# Patient Record
Sex: Female | Born: 1940
Health system: Southern US, Community
[De-identification: ages and names within clinical notes are randomized; demographics above are authoritative.]

## PROBLEM LIST (undated history)

## (undated) DIAGNOSIS — Z96651 Presence of right artificial knee joint: Secondary | ICD-10-CM

## (undated) DIAGNOSIS — I8390 Asymptomatic varicose veins of unspecified lower extremity: Secondary | ICD-10-CM

## (undated) DIAGNOSIS — I2510 Atherosclerotic heart disease of native coronary artery without angina pectoris: Secondary | ICD-10-CM

## (undated) DIAGNOSIS — M8580 Other specified disorders of bone density and structure, unspecified site: Secondary | ICD-10-CM

## (undated) DIAGNOSIS — E785 Hyperlipidemia, unspecified: Secondary | ICD-10-CM

## (undated) DIAGNOSIS — M1990 Unspecified osteoarthritis, unspecified site: Secondary | ICD-10-CM

## (undated) DIAGNOSIS — Z6835 Body mass index (BMI) 35.0-35.9, adult: Secondary | ICD-10-CM

## (undated) DIAGNOSIS — E559 Vitamin D deficiency, unspecified: Secondary | ICD-10-CM

## (undated) DIAGNOSIS — R011 Cardiac murmur, unspecified: Secondary | ICD-10-CM

## (undated) DIAGNOSIS — E669 Obesity, unspecified: Secondary | ICD-10-CM

## (undated) DIAGNOSIS — K219 Gastro-esophageal reflux disease without esophagitis: Secondary | ICD-10-CM

## (undated) DIAGNOSIS — M3500 Sicca syndrome, unspecified: Secondary | ICD-10-CM

## (undated) DIAGNOSIS — R0981 Nasal congestion: Secondary | ICD-10-CM

## (undated) DIAGNOSIS — M549 Dorsalgia, unspecified: Secondary | ICD-10-CM

## (undated) DIAGNOSIS — H04129 Dry eye syndrome of unspecified lacrimal gland: Secondary | ICD-10-CM

## (undated) DIAGNOSIS — I10 Essential (primary) hypertension: Secondary | ICD-10-CM

## (undated) DIAGNOSIS — K5900 Constipation, unspecified: Secondary | ICD-10-CM

## (undated) HISTORY — DX: Nasal congestion: R09.81

## (undated) HISTORY — DX: Other specified disorders of bone density and structure, unspecified site: M85.80

## (undated) HISTORY — DX: Atherosclerotic heart disease of native coronary artery without angina pectoris: I25.10

## (undated) HISTORY — DX: Vitamin D deficiency, unspecified: E55.9

## (undated) HISTORY — DX: Constipation, unspecified: K59.00

## (undated) HISTORY — DX: Sjogren syndrome, unspecified: M35.00

## (undated) HISTORY — DX: Asymptomatic varicose veins of unspecified lower extremity: I83.90

## (undated) HISTORY — DX: Hyperlipidemia, unspecified: E78.5

## (undated) HISTORY — PX: APPENDECTOMY: SHX54

## (undated) HISTORY — DX: Presence of right artificial knee joint: Z96.651

## (undated) HISTORY — PX: ROTATOR CUFF REPAIR: SHX139

## (undated) HISTORY — DX: Unspecified osteoarthritis, unspecified site: M19.90

## (undated) HISTORY — PX: ABDOMINAL HYSTERECTOMY: SHX81

## (undated) HISTORY — DX: Dorsalgia, unspecified: M54.9

## (undated) HISTORY — PX: TONSILLECTOMY: SUR1361

## (undated) HISTORY — DX: Dry eye syndrome of unspecified lacrimal gland: H04.129

## (undated) HISTORY — DX: Body mass index (BMI) 35.0-35.9, adult: Z68.35

## (undated) HISTORY — DX: Gastro-esophageal reflux disease without esophagitis: K21.9

## (undated) HISTORY — DX: Obesity, unspecified: E66.9

## (undated) HISTORY — DX: Cardiac murmur, unspecified: R01.1

---

## 2002-06-12 ENCOUNTER — Encounter: Payer: Self-pay | Admitting: Rheumatology

## 2002-06-12 ENCOUNTER — Encounter: Admission: RE | Admit: 2002-06-12 | Discharge: 2002-06-12 | Payer: Self-pay | Admitting: Rheumatology

## 2002-06-16 ENCOUNTER — Encounter: Payer: Self-pay | Admitting: *Deleted

## 2002-06-16 ENCOUNTER — Encounter: Admission: RE | Admit: 2002-06-16 | Discharge: 2002-06-16 | Payer: Self-pay | Admitting: *Deleted

## 2002-07-31 ENCOUNTER — Ambulatory Visit (HOSPITAL_BASED_OUTPATIENT_CLINIC_OR_DEPARTMENT_OTHER): Admission: RE | Admit: 2002-07-31 | Discharge: 2002-07-31 | Payer: Self-pay | Admitting: Orthopedic Surgery

## 2007-03-24 ENCOUNTER — Encounter: Admission: RE | Admit: 2007-03-24 | Discharge: 2007-03-24 | Payer: Self-pay | Admitting: *Deleted

## 2008-10-23 ENCOUNTER — Encounter: Admission: RE | Admit: 2008-10-23 | Discharge: 2008-10-23 | Payer: Self-pay | Admitting: Family Medicine

## 2009-05-08 ENCOUNTER — Encounter: Admission: RE | Admit: 2009-05-08 | Discharge: 2009-08-06 | Payer: Self-pay | Admitting: Family Medicine

## 2009-05-10 ENCOUNTER — Encounter: Admission: RE | Admit: 2009-05-10 | Discharge: 2009-05-10 | Payer: Self-pay | Admitting: Family Medicine

## 2009-11-06 ENCOUNTER — Encounter: Admission: RE | Admit: 2009-11-06 | Discharge: 2009-11-06 | Payer: Self-pay | Admitting: Family Medicine

## 2010-08-12 ENCOUNTER — Encounter: Admission: RE | Admit: 2010-08-12 | Discharge: 2010-09-24 | Payer: Self-pay | Admitting: Family Medicine

## 2011-01-18 ENCOUNTER — Encounter: Payer: Self-pay | Admitting: *Deleted

## 2011-04-02 ENCOUNTER — Other Ambulatory Visit: Payer: Self-pay | Admitting: Family Medicine

## 2011-04-02 DIAGNOSIS — Z1231 Encounter for screening mammogram for malignant neoplasm of breast: Secondary | ICD-10-CM

## 2011-04-09 ENCOUNTER — Ambulatory Visit
Admission: RE | Admit: 2011-04-09 | Discharge: 2011-04-09 | Disposition: A | Discharge status: Home / Self Care | Payer: Medicare Other | Source: Ambulatory Visit | Attending: Family Medicine | Admitting: Family Medicine

## 2011-04-09 DIAGNOSIS — Z1231 Encounter for screening mammogram for malignant neoplasm of breast: Secondary | ICD-10-CM

## 2011-05-06 ENCOUNTER — Other Ambulatory Visit: Payer: Self-pay | Admitting: Family Medicine

## 2011-05-06 DIAGNOSIS — M8580 Other specified disorders of bone density and structure, unspecified site: Secondary | ICD-10-CM

## 2011-05-15 NOTE — Op Note (Signed)
Beverly Moreno, Beverly Moreno                          ACCOUNT NO.:  [id number]   MEDICAL RECORD NO.:  [id number]                   PATIENT TYPE:   LOCATION:                                       FACILITY:   PHYSICIAN:  Elana Alm. Thurston Hole, M.D.              DATE OF BIRTH:   DATE OF PROCEDURE:  07/31/2002  DATE OF DISCHARGE:                                 OPERATIVE REPORT   PREOPERATIVE DIAGNOSES:  1. Right shoulder rotator cuff tear.  2. Right shoulder partial glenoid labrum tear.  3. Right shoulder partial biceps tendon tear.  4. Right shoulder impingement.  5. Right shoulder acromioclavicular joint arthrosis.   POSTOPERATIVE DIAGNOSES:  1. Right shoulder rotator cuff tear.  2. Right shoulder partial glenoid labrum tear.  3. Right shoulder partial biceps tendon tear.  4. Right shoulder impingement.  5. Right shoulder acromioclavicular joint arthrosis.   PROCEDURES:  1. Right shoulder examination under anesthesia followed arthroscopic partial     labrum tear and partial biceps tendon tear debridement.  2. Right shoulder rotator cuff repair using Arthrex suture anchor x 1.  3. Right shoulder subacromial decompression.  4. Right shoulder distal clavicle excision.   SURGEON:  Elana Alm. Thurston Hole, M.D.   ASSISTANT:  Julien Girt, P.A.   ANESTHESIA:  General.   OPERATIVE TIME:  One hour and ten minutes.   COMPLICATIONS:  None.   INDICATIONS FOR PROCEDURE:  The patient is a 70 year old woman who sustained  a right shoulder dislocation approximately one-and-a-half years ago.  She  underwent extensive physical therapy, but has had increasing pain in her  shoulder with exam and MRI now documenting a complete rotator cuff tear and  partial labrum tear.  She has failed conservative care and is now to undergo  arthroscopy.   DESCRIPTION OF PROCEDURE:  The patient was brought to the operating room on  July 31, 2002, after a block had been placed in the holding room.  She was  placed on the operating table in the supine position.  After being placed  under general anesthesia, her right shoulder was examined under anesthesia.  She had full range of motion and her shoulder was stable to ligamentous  exam.  After this was done, she was placed in the beach chair position and  her shoulder and arm were prepped using sterile Duraprep and draped using  sterile technique.  She received Ancef 1 g IV preoperatively for  prophylaxis.  Initially the arthroscopy was performed through a posterior  arthroscopic portal.  The arthroscope with a pump attached was placed into  an anterior portal and arthroscopic probe was placed.  On initial  inspection, the articular cartilage and glenohumeral joint were intact,  except for grade 2 changes over her humeral head, which was debrided.  The  anterior labrum in the mid portion was intact.  The inferior labrum and  anterior inferior glenoid ligament complex were intact.  The superior labrum  showed partial tearing 25-30%, as well as a partial tear of the biceps  tendon anchor, which was debrided.  The biceps tendon anchor otherwise was  well affixed.  She had significant partial tearing of the biceps tendon  itself, 25-30%, which was debrided.  The posterior labrum was intact.  The  inferior capsular recess was free of pathology.  The rotator cuff showed a  complete tear of the supraspinatus, which was partially debrided  arthroscopically.  The infraspinatus, teres minor, and subscapularis were  intact.  The subacromial space was entered and a lateral arthroscopic portal  was made.  A large amount of bursitis was resected.  The rotator cuff tear  was further debrided arthroscopically.  Subacromial decompression was  carried out, removing 6 mm of the undersurface of the anterior,  anterolateral, and anteromedial acromion and a CA ligament release carried  out as well.  The [facility] joint was exposed.  Significant arthrosis and spurring  noted  in this joint and the distal 5 mm of the clavicle was resected with a  6 mm bur.  After this was done and through the lateral arthroscopic portal,  this was extended through a 3 cm deltoid-splitting incision.  The underlying  subcutaneous tissues were incised in line with the skin incision.  The  deltoid muscle was split longitudinally, entering the subacromial space.  The rotator cuff tear was further debrided sharply and then an Arthrex  suture anchor was placed in the greater tuberosity.  Each of the sutures  were then passed through the rotator cuff and tied down, thus resecuring the  rotator cuff back down to the greater tuberosity.  After this was done,  there was found to be excellent restoration of stability to the rotator  cuff.  The shoulder could be brought through a full range of motion with no  impingement on the repair.  A subacromial pain catheter was then placed for  postoperative pain control.  The wound was thoroughly irrigated and then the  deltoid fascia closed with 0 Vicryl.  The subcutaneous tissues were closed  with 2-0 Vicryl.  The subcuticular layer was closed with 3-0 Prolene and  Steri-Strips were applied.  Sterile dressings and a sling were applied.  The  patient was awaken and taken to the recovery room in stable condition.   FOLLOW-UP CARE:  The patient will be followed overnight through the recovery  care center for IV pain control and neurovascular monitoring.  Discharge  tomorrow on Percocet and Naprosyn.  Begin early physical therapy for passive  range of motion only.  She will be seen back in the office in a week for  sutures out and follow-up.  Her family will remove her pain catheter per our  instructions in 48 hours.                                                Robert A. Thurston Hole, M.D.    RAW/MEDQ  D:  07/31/2002  T:  08/04/2002  Job:  [id number]

## 2011-11-09 ENCOUNTER — Ambulatory Visit
Admission: RE | Admit: 2011-11-09 | Discharge: 2011-11-09 | Disposition: A | Discharge status: Home / Self Care | Payer: BLUE CROSS/BLUE SHIELD | Source: Ambulatory Visit | Attending: Family Medicine | Admitting: Family Medicine

## 2011-11-09 DIAGNOSIS — M8580 Other specified disorders of bone density and structure, unspecified site: Secondary | ICD-10-CM

## 2011-12-30 DIAGNOSIS — H5319 Other subjective visual disturbances: Secondary | ICD-10-CM | POA: Diagnosis not present

## 2011-12-30 DIAGNOSIS — H43399 Other vitreous opacities, unspecified eye: Secondary | ICD-10-CM | POA: Diagnosis not present

## 2011-12-30 DIAGNOSIS — H35369 Drusen (degenerative) of macula, unspecified eye: Secondary | ICD-10-CM | POA: Diagnosis not present

## 2011-12-30 DIAGNOSIS — H43819 Vitreous degeneration, unspecified eye: Secondary | ICD-10-CM | POA: Diagnosis not present

## 2012-01-12 DIAGNOSIS — L259 Unspecified contact dermatitis, unspecified cause: Secondary | ICD-10-CM | POA: Diagnosis not present

## 2012-01-12 DIAGNOSIS — L570 Actinic keratosis: Secondary | ICD-10-CM | POA: Diagnosis not present

## 2012-01-19 DIAGNOSIS — M1990 Unspecified osteoarthritis, unspecified site: Secondary | ICD-10-CM | POA: Diagnosis not present

## 2012-01-19 DIAGNOSIS — M549 Dorsalgia, unspecified: Secondary | ICD-10-CM | POA: Diagnosis not present

## 2012-01-19 DIAGNOSIS — M5137 Other intervertebral disc degeneration, lumbosacral region: Secondary | ICD-10-CM | POA: Diagnosis not present

## 2012-01-25 DIAGNOSIS — M24819 Other specific joint derangements of unspecified shoulder, not elsewhere classified: Secondary | ICD-10-CM | POA: Diagnosis not present

## 2012-02-19 DIAGNOSIS — L578 Other skin changes due to chronic exposure to nonionizing radiation: Secondary | ICD-10-CM | POA: Diagnosis not present

## 2012-04-13 DIAGNOSIS — Z961 Presence of intraocular lens: Secondary | ICD-10-CM | POA: Diagnosis not present

## 2012-04-13 DIAGNOSIS — H26499 Other secondary cataract, unspecified eye: Secondary | ICD-10-CM | POA: Diagnosis not present

## 2012-04-13 DIAGNOSIS — H524 Presbyopia: Secondary | ICD-10-CM | POA: Diagnosis not present

## 2012-04-13 DIAGNOSIS — H02829 Cysts of unspecified eye, unspecified eyelid: Secondary | ICD-10-CM | POA: Diagnosis not present

## 2012-04-13 DIAGNOSIS — H11449 Conjunctival cysts, unspecified eye: Secondary | ICD-10-CM | POA: Diagnosis not present

## 2012-06-14 DIAGNOSIS — I10 Essential (primary) hypertension: Secondary | ICD-10-CM | POA: Diagnosis not present

## 2012-06-14 DIAGNOSIS — M949 Disorder of cartilage, unspecified: Secondary | ICD-10-CM | POA: Diagnosis not present

## 2012-06-14 DIAGNOSIS — M899 Disorder of bone, unspecified: Secondary | ICD-10-CM | POA: Diagnosis not present

## 2012-06-14 DIAGNOSIS — E782 Mixed hyperlipidemia: Secondary | ICD-10-CM | POA: Diagnosis not present

## 2012-06-22 ENCOUNTER — Other Ambulatory Visit: Payer: Self-pay | Admitting: Family Medicine

## 2012-06-22 DIAGNOSIS — Z1231 Encounter for screening mammogram for malignant neoplasm of breast: Secondary | ICD-10-CM

## 2012-07-01 ENCOUNTER — Ambulatory Visit
Admission: RE | Admit: 2012-07-01 | Discharge: 2012-07-01 | Disposition: A | Discharge status: Home / Self Care | Payer: BLUE CROSS/BLUE SHIELD | Source: Ambulatory Visit | Attending: Family Medicine | Admitting: Family Medicine

## 2012-07-01 DIAGNOSIS — Z1231 Encounter for screening mammogram for malignant neoplasm of breast: Secondary | ICD-10-CM

## 2012-08-05 DIAGNOSIS — M1710 Unilateral primary osteoarthritis, unspecified knee: Secondary | ICD-10-CM | POA: Diagnosis not present

## 2012-08-05 DIAGNOSIS — IMO0002 Reserved for concepts with insufficient information to code with codable children: Secondary | ICD-10-CM | POA: Diagnosis not present

## 2012-08-27 DIAGNOSIS — M25569 Pain in unspecified knee: Secondary | ICD-10-CM | POA: Diagnosis not present

## 2012-09-01 DIAGNOSIS — M25569 Pain in unspecified knee: Secondary | ICD-10-CM | POA: Diagnosis not present

## 2012-09-27 DIAGNOSIS — IMO0002 Reserved for concepts with insufficient information to code with codable children: Secondary | ICD-10-CM | POA: Diagnosis not present

## 2012-09-27 DIAGNOSIS — M1710 Unilateral primary osteoarthritis, unspecified knee: Secondary | ICD-10-CM | POA: Diagnosis not present

## 2012-10-31 DIAGNOSIS — Z23 Encounter for immunization: Secondary | ICD-10-CM | POA: Diagnosis not present

## 2012-11-01 DIAGNOSIS — D1801 Hemangioma of skin and subcutaneous tissue: Secondary | ICD-10-CM | POA: Diagnosis not present

## 2012-11-01 DIAGNOSIS — L723 Sebaceous cyst: Secondary | ICD-10-CM | POA: Diagnosis not present

## 2012-11-01 DIAGNOSIS — L819 Disorder of pigmentation, unspecified: Secondary | ICD-10-CM | POA: Diagnosis not present

## 2012-11-01 DIAGNOSIS — L821 Other seborrheic keratosis: Secondary | ICD-10-CM | POA: Diagnosis not present

## 2012-11-01 DIAGNOSIS — L909 Atrophic disorder of skin, unspecified: Secondary | ICD-10-CM | POA: Diagnosis not present

## 2012-11-01 DIAGNOSIS — L919 Hypertrophic disorder of the skin, unspecified: Secondary | ICD-10-CM | POA: Diagnosis not present

## 2012-11-21 DIAGNOSIS — M542 Cervicalgia: Secondary | ICD-10-CM | POA: Diagnosis not present

## 2013-03-29 DIAGNOSIS — M1710 Unilateral primary osteoarthritis, unspecified knee: Secondary | ICD-10-CM | POA: Diagnosis not present

## 2013-03-29 DIAGNOSIS — IMO0002 Reserved for concepts with insufficient information to code with codable children: Secondary | ICD-10-CM | POA: Diagnosis not present

## 2013-04-05 DIAGNOSIS — IMO0002 Reserved for concepts with insufficient information to code with codable children: Secondary | ICD-10-CM | POA: Diagnosis not present

## 2013-04-05 DIAGNOSIS — M1710 Unilateral primary osteoarthritis, unspecified knee: Secondary | ICD-10-CM | POA: Diagnosis not present

## 2013-04-12 DIAGNOSIS — M1710 Unilateral primary osteoarthritis, unspecified knee: Secondary | ICD-10-CM | POA: Diagnosis not present

## 2013-04-12 DIAGNOSIS — IMO0002 Reserved for concepts with insufficient information to code with codable children: Secondary | ICD-10-CM | POA: Diagnosis not present

## 2013-05-03 DIAGNOSIS — H04129 Dry eye syndrome of unspecified lacrimal gland: Secondary | ICD-10-CM | POA: Diagnosis not present

## 2013-05-03 DIAGNOSIS — H43399 Other vitreous opacities, unspecified eye: Secondary | ICD-10-CM | POA: Diagnosis not present

## 2013-05-03 DIAGNOSIS — H35369 Drusen (degenerative) of macula, unspecified eye: Secondary | ICD-10-CM | POA: Diagnosis not present

## 2013-05-03 DIAGNOSIS — H26499 Other secondary cataract, unspecified eye: Secondary | ICD-10-CM | POA: Diagnosis not present

## 2013-05-26 DIAGNOSIS — M1710 Unilateral primary osteoarthritis, unspecified knee: Secondary | ICD-10-CM | POA: Diagnosis not present

## 2013-05-26 DIAGNOSIS — IMO0002 Reserved for concepts with insufficient information to code with codable children: Secondary | ICD-10-CM | POA: Diagnosis not present

## 2013-07-04 DIAGNOSIS — M949 Disorder of cartilage, unspecified: Secondary | ICD-10-CM | POA: Diagnosis not present

## 2013-07-04 DIAGNOSIS — M899 Disorder of bone, unspecified: Secondary | ICD-10-CM | POA: Diagnosis not present

## 2013-07-04 DIAGNOSIS — I10 Essential (primary) hypertension: Secondary | ICD-10-CM | POA: Diagnosis not present

## 2013-07-04 DIAGNOSIS — E782 Mixed hyperlipidemia: Secondary | ICD-10-CM | POA: Diagnosis not present

## 2013-10-05 DIAGNOSIS — Z23 Encounter for immunization: Secondary | ICD-10-CM | POA: Diagnosis not present

## 2013-10-20 DIAGNOSIS — M1710 Unilateral primary osteoarthritis, unspecified knee: Secondary | ICD-10-CM | POA: Diagnosis not present

## 2013-10-20 DIAGNOSIS — IMO0002 Reserved for concepts with insufficient information to code with codable children: Secondary | ICD-10-CM | POA: Diagnosis not present

## 2013-10-30 DIAGNOSIS — IMO0002 Reserved for concepts with insufficient information to code with codable children: Secondary | ICD-10-CM | POA: Diagnosis not present

## 2013-10-30 DIAGNOSIS — M1710 Unilateral primary osteoarthritis, unspecified knee: Secondary | ICD-10-CM | POA: Diagnosis not present

## 2013-10-31 DIAGNOSIS — L821 Other seborrheic keratosis: Secondary | ICD-10-CM | POA: Diagnosis not present

## 2013-10-31 DIAGNOSIS — L819 Disorder of pigmentation, unspecified: Secondary | ICD-10-CM | POA: Diagnosis not present

## 2013-10-31 DIAGNOSIS — L909 Atrophic disorder of skin, unspecified: Secondary | ICD-10-CM | POA: Diagnosis not present

## 2013-10-31 DIAGNOSIS — D1801 Hemangioma of skin and subcutaneous tissue: Secondary | ICD-10-CM | POA: Diagnosis not present

## 2013-11-06 DIAGNOSIS — M1710 Unilateral primary osteoarthritis, unspecified knee: Secondary | ICD-10-CM | POA: Diagnosis not present

## 2013-11-06 DIAGNOSIS — IMO0002 Reserved for concepts with insufficient information to code with codable children: Secondary | ICD-10-CM | POA: Diagnosis not present

## 2014-01-02 DIAGNOSIS — IMO0002 Reserved for concepts with insufficient information to code with codable children: Secondary | ICD-10-CM | POA: Diagnosis not present

## 2014-01-02 DIAGNOSIS — M1710 Unilateral primary osteoarthritis, unspecified knee: Secondary | ICD-10-CM | POA: Diagnosis not present

## 2014-03-07 DIAGNOSIS — IMO0002 Reserved for concepts with insufficient information to code with codable children: Secondary | ICD-10-CM | POA: Diagnosis not present

## 2014-03-07 DIAGNOSIS — R011 Cardiac murmur, unspecified: Secondary | ICD-10-CM | POA: Diagnosis not present

## 2014-03-07 DIAGNOSIS — M1710 Unilateral primary osteoarthritis, unspecified knee: Secondary | ICD-10-CM | POA: Diagnosis not present

## 2014-03-08 ENCOUNTER — Other Ambulatory Visit: Payer: Self-pay | Admitting: Orthopedic Surgery

## 2014-03-22 ENCOUNTER — Encounter (HOSPITAL_COMMUNITY): Payer: Self-pay | Admitting: Pharmacy Technician

## 2014-03-23 ENCOUNTER — Other Ambulatory Visit: Payer: Self-pay | Admitting: Orthopedic Surgery

## 2014-03-23 NOTE — H&P (Signed)
Beverly Moreno. Culliver DOB: 1941/07/21 Married / Language: English / Race: White Female  Date of Admission: 04-09-2014  Chief Complaint:  Right Knee Pain  History of Present Illness The patient is a 73 year old female who comes in for a preoperative History and Physical. The patient is scheduled for a right total knee arthroplasty to be performed by Dr. Dione Plover. Aluisio, MD at [facility] on 04-18-2014. The patient is a 73 year old female who presents for follow up of their knee. The patient is being followed for their right osteoarthritis. They are now 7 week(s) out from Syvisc series . Symptoms reported today include: pain (lateral as well as posterior ), swelling and grinding. The patient feels that they are doing poorly. Current treatment includes: NSAIDs (Ibuprofen 800mg ) and water aerobics. The following medication has been used for pain control: none. The patient has not gotten any relief of their symptoms with viscosupplementation. She states that the knee is bothering her at all times. It has gotten progressively worse over the past year or so. It is hurting at all times and limiting what she can and can not do. The visco supplement injections did not help this time. She is now ready to proceed with knee surgery. They have been treated conservatively in the past for the above stated problem and despite conservative measures, they continue to have progressive pain and severe functional limitations and dysfunction. They have failed non-operative management including home exercise, medications, and injections. It is felt that they would benefit from undergoing total joint replacement. Risks and benefits of the procedure have been discussed with the patient and they elect to proceed with surgery. There are no active contraindications to surgery such as ongoing infection or rapidly progressive neurological disease.  Allergies No Known Drug Allergies   Problem List/Past  Medical Pain, Lumbar (LBP) (724.2) Knee pain (719.46) Shoulder instability (718.81) Osteoarthritis, Lumbar (715.98) Primary osteoarthritis of one knee (715.16) High blood pressure Osteoarthritis Osteoporosis Hemorrhoids Hypercholesterolemia Varicose veins Menopause   Family History Osteoporosis. mother and sister Osteoarthritis. mother and sister Hypertension. mother and father Cancer. sister grandmother mothers side Heart Disease. First Degree Relatives. mother Cerebrovascular Accident. First Degree Relatives. father   Social History Previously in rehab. no Pain Contract. no Drug/Alcohol Rehab (Previously). no Tobacco / smoke exposure. no Number of flights of stairs before winded. 2-3 Illicit drug use. no Exercise. Exercises daily; does other Exercises daily; does running / walking and other Marital status. married Living situation. live with spouse Tobacco use. Former smoker. former smoker; smoke(d) 1 pack(s) per day former smoker; smoke(d) 1 1/2 pack(s) per day Children. 2 Alcohol use. current drinker; drinks wine; less than 5 per week Drug/Alcohol Rehab (Currently). no Current work status. retired Regulatory affairs officer. Healthcare POA   Medication History Benicar HCT (20-12.5MG  Tablet, Oral) Active. Lipitor (20MG  Tablet, Oral) Active. Ocuvite Adult Formula ( Oral) Active. Biotin Forte (3MG  Tablet, Oral) Active. Calcium Citrate + ( Oral) Active. Omega 3 ( Oral) Specific dose unknown - Active.   Past Surgical History Cataract Surgery. bilateral Tonsillectomy Hysterectomy. Date: 63. complete (non-cancerous) Appendectomy Rotator Cuff Repair. Date: 2002. right   Review of Systems General:Not Present- Chills, Fever, Night Sweats, Fatigue, Weight Gain, Weight Loss and Memory Loss. Skin:Not Present- Hives, Itching, Rash, Eczema and Lesions. HEENT:Not Present- Tinnitus, Headache, Double Vision, Visual Loss, Hearing Loss  and Dentures. Respiratory:Not Present- Shortness of breath with exertion, Shortness of breath at rest, Allergies, Coughing up blood and Chronic Cough. Cardiovascular:Not Present- Chest Pain, Racing/skipping  heartbeats, Difficulty Breathing Lying Down, Murmur, Swelling and Palpitations. Gastrointestinal:Present- Constipation. Not Present- Bloody Stool, Heartburn, Abdominal Pain, Vomiting, Nausea, Diarrhea, Difficulty Swallowing, Jaundice and Loss of appetitie. Female Genitourinary:Present- Urinating at Night. Not Present- Blood in Urine, Urinary frequency, Weak urinary stream, Discharge, Flank Pain, Incontinence, Painful Urination, Urgency and Urinary Retention. Musculoskeletal:Present- Joint Pain and Morning Stiffness. Not Present- Muscle Weakness, Muscle Pain, Joint Swelling, Back Pain and Spasms. Neurological:Not Present- Tremor, Dizziness, Blackout spells, Paralysis, Difficulty with balance and Weakness. Psychiatric:Not Present- Insomnia.    Vitals Pulse: 58 (Regular) Resp.: 16 (Unlabored) BP: 145/72 (Sitting, Right Arm, Standard)     Physical Exam The physical exam findings are as follows:   General Mental Status - Alert, cooperative and good historian. General Appearance- pleasant. Not in acute distress. Orientation- Oriented X3. Build & Nutrition- Well nourished and Well developed.   Head and Neck Head- normocephalic, atraumatic . Neck Global Assessment- supple. no bruit auscultated on the right and no bruit auscultated on the left.   Eye Pupil- Bilateral- Regular and Round. Motion- Bilateral- EOMI.   Chest and Lung Exam Auscultation: Breath sounds:- clear at anterior chest wall and - clear at posterior chest wall. Adventitious sounds:- No Adventitious sounds.   Cardiovascular Auscultation:Rhythm- Regular rate and rhythm. Heart Sounds- S1 WNL and S2 WNL. Murmurs & Other Heart Sounds: Murmur 1:Location- Aortic Area.  Timing- Mid-systolic. Grade- II/VI.   Abdomen Palpation/Percussion:Tenderness- Abdomen is non-tender to palpation. Rigidity (guarding)- Abdomen is soft. Auscultation:Auscultation of the abdomen reveals - Bowel sounds normal.   Female Genitourinary Not done, not pertinent to present illness  Musculoskeletal On exam she is a well developed female alert and oriented in no apparent distress. The right knee shows no effusion. There is marked crepitus on range of motion of the knee. Range is about 5 to 125. She is tender medial greater than lateral with no instability noted.  RADIOGRAPHS: AP both knees and lateral of the right show she has advanced arthritis now. She is bone on bone in the medial compartment with patellofemoral bone on bone also. She has large osteophytes present.   Assessment & Plan Primary osteoarthritis of one knee (715.16) Impression: Right Knee  Note: Plan is for a Right Total Knee Replacement by Dr. Wynelle Link.  Plan is to go home with husband  PCP - Dr. Myriam Jacobson - Patient has been seen preoperatively and felt to be stable for surgery.  The patient does not have any contraindications and will receive TXA (tranexamic acid) prior to surgery.  Signed electronically by Joelene Millin, III PA-C

## 2014-03-27 NOTE — Patient Instructions (Addendum)
20     Your procedure is scheduled on:  Monday 04/09/2014  Report to [facility] at  [phone] AM.  Call this number if you have problems the night before or morning of surgery: [phone]   Remember:             IF YOU USE CPAP,BRING MASK AND TUBING AM OF SURGERY!             IF YOU DO NOT HAVE YOUR TYPE AND SCREEN DRAWN AT PRE-ADMIT APPOINTMENT, YOU WILL HAVE IT DRAWN AM OF SURGERY!   Do not eat food or drink liquids AFTER MIDNIGHT!  Take these medicines the morning of surgery with A SIP OF WATER: NONE    [facility] IS NOT RESPONSIBLE FOR ANY BELONGINGS OR VALUABLES BROUGHT TO HOSPITAL.  Marland Kitchen  Leave suitcase in the car. After surgery it may be brought to your room.  For patients admitted to the hospital, checkout time is 11:00 AM the day of              Discharge.    DO NOT WEAR JEWELRY,MAKE-UP,LOTIONS,POWDERS,PERFUMES,CONTACTS , DENTURES OR BRIDGEWORK ,AND DO NOT WEAR FALSE EYELASHES                                    Patients discharged the day of surgery will not be allowed to drive home.   If going home the same day of surgery, must have someone stay with you first 24 hrs.at home and arrange for someone to drive you home from the [place] IS:N/A   Special Instructions:              Please read over the following fact sheets that you were given:             1. [place].Tobin Chad     [phone]                              [facility] - Preparing for Surgery Before surgery, you can play an important role.  Because skin is not sterile, your skin needs to be as free of germs as possible.  You can reduce the number of germs on your skin by washing with CHG (chlorahexidine gluconate) soap before surgery.  CHG is an antiseptic cleaner which kills germs and bonds with the skin to continue killing germs even after  washing. Please DO NOT use if you have an allergy to CHG or antibacterial soaps.  If your skin becomes reddened/irritated stop using the CHG and inform your nurse when you arrive at Short Stay. Do not shave (including legs and underarms) for at least 48 hours prior to the first CHG shower.  You may shave your face. Please follow these instructions carefully:  1.  Shower with CHG Soap the night before surgery and the  morning of Surgery.  2.  If you choose to wash your hair, wash your hair first as usual with your  normal  shampoo.  3.  After you shampoo, rinse your hair and body thoroughly to remove the  shampoo.                           4.  Use CHG as you would any other liquid soap.  You can apply chg directly  to the skin and wash                       Gently with a scrungie or clean washcloth.  5.  Apply the CHG Soap to your body ONLY FROM THE NECK DOWN.   Do not use on open                           Wound or open sores. Avoid contact with eyes, ears mouth and genitals (private parts).                        Genitals (private parts) with your normal soap.             6.  Wash thoroughly, paying special attention to the area where your surgery  will be performed.  7.  Thoroughly rinse your body with warm water from the neck down.  8.  DO NOT shower/wash with your normal soap after using and rinsing off  the CHG Soap.                9.  Pat yourself dry with a clean towel.            10.  Wear clean pajamas.            11.  Place clean sheets on your bed the night of your first shower and do not  sleep with pets. Day of Surgery : Do not apply any lotions/deodorants the morning of surgery.  Please wear clean clothes to the hospital/surgery center.  FAILURE TO FOLLOW THESE INSTRUCTIONS MAY RESULT IN THE CANCELLATION OF YOUR SURGERY PATIENT SIGNATURE_________________________________  NURSE SIGNATURE__________________________________  Incentive Spirometer  An incentive spirometer is a tool  that can help keep your lungs clear and active. This tool measures how well you are filling your lungs with each breath. Taking long deep breaths may help reverse or decrease the chance of developing breathing (pulmonary) problems (especially infection) following:  A long period of time when you are unable to move or be active. BEFORE THE PROCEDURE   If the spirometer includes an indicator to show your best effort, your nurse or respiratory therapist will set it to a desired goal.  If possible, sit up straight or lean slightly forward. Try not to slouch.  Hold the incentive spirometer in an upright position. INSTRUCTIONS FOR USE  1. Sit on the edge of your bed if possible, or sit up as far as you can in bed or on a chair. 2. Hold the incentive spirometer in an upright position. 3. Breathe out normally. 4. Place the mouthpiece in your mouth and seal your lips tightly around it. 5. Breathe in slowly and as deeply as possible, raising the piston or the ball toward the top of the column. 6. Hold your breath for 3-5 seconds or for as long as possible. Allow the piston or ball to fall to the bottom  of the column. 7. Remove the mouthpiece from your mouth and breathe out normally. 8. Rest for a few seconds and repeat Steps 1 through 7 at least 10 times every 1-2 hours when you are awake. Take your time and take a few normal breaths between deep breaths. 9. The spirometer may include an indicator to show your best effort. Use the indicator as a goal to work toward during each repetition. 10. After each set of 10 deep breaths, practice coughing to be sure your lungs are clear. If you have an incision (the cut made at the time of surgery), support your incision when coughing by placing a pillow or rolled up towels firmly against it. Once you are able to get out of bed, walk around indoors and cough well. You may stop using the incentive spirometer when instructed by your caregiver.  RISKS AND  COMPLICATIONS  Take your time so you do not get dizzy or light-headed.  If you are in pain, you may need to take or ask for pain medication before doing incentive spirometry. It is harder to take a deep breath if you are having pain. AFTER USE  Rest and breathe slowly and easily.  It can be helpful to keep track of a log of your progress. Your caregiver can provide you with a simple table to help with this. If you are using the spirometer at home, follow these instructions: [place] IF:   You are having difficultly using the spirometer.  You have trouble using the spirometer as often as instructed.  Your pain medication is not giving enough relief while using the spirometer.  You develop fever of 100.5 F (38.1 C) or higher. SEEK IMMEDIATE MEDICAL CARE IF:   You cough up bloody sputum that had not been present before.  You develop fever of 102 F (38.9 C) or greater.  You develop worsening pain at or near the incision site. MAKE SURE YOU:   Understand these instructions.  Will watch your condition.  Will get help right away if you are not doing well or get worse. Document Released: 04/26/2007 Document Revised: 03/07/2012 Document Reviewed: 06/27/2007 [facility] Patient Information 2014 [place], [place].   WHAT IS A BLOOD TRANSFUSION? Blood Transfusion Information  A transfusion is the replacement of blood or some of its parts. Blood is made up of multiple cells which provide different functions.  Red blood cells carry oxygen and are used for blood loss replacement.  White blood cells fight against infection.  Platelets control bleeding.  Plasma helps clot blood.  Other blood products are available for specialized needs, such as hemophilia or other clotting disorders. BEFORE THE TRANSFUSION  Who gives blood for transfusions?   Healthy volunteers who are fully evaluated to make sure their blood is safe. This is blood bank blood. Transfusion therapy is the  safest it has ever been in the practice of medicine. Before blood is taken from a donor, a complete history is taken to make sure that person has no history of diseases nor engages in risky social behavior (examples are intravenous drug use or sexual activity with multiple partners). The donor's travel history is screened to minimize risk of transmitting infections, such as malaria. The donated blood is tested for signs of infectious diseases, such as HIV and hepatitis. The blood is then tested to be sure it is compatible with you in order to minimize the chance of a transfusion reaction. If you or a relative donates blood, this is often done  in anticipation of surgery and is not appropriate for emergency situations. It takes many days to process the donated blood. RISKS AND COMPLICATIONS Although transfusion therapy is very safe and saves many lives, the main dangers of transfusion include:   Getting an infectious disease.  Developing a transfusion reaction. This is an allergic reaction to something in the blood you were given. Every precaution is taken to prevent this. The decision to have a blood transfusion has been considered carefully by your caregiver before blood is given. Blood is not given unless the benefits outweigh the risks. AFTER THE TRANSFUSION  Right after receiving a blood transfusion, you will usually feel much better and more energetic. This is especially true if your red blood cells have gotten low (anemic). The transfusion raises the level of the red blood cells which carry oxygen, and this usually causes an energy increase.  The nurse administering the transfusion will monitor you carefully for complications. HOME CARE INSTRUCTIONS  No special instructions are needed after a transfusion. You may find your energy is better. Speak with your caregiver about any limitations on activity for underlying diseases you may have. SEEK MEDICAL CARE IF:   Your condition is not improving  after your transfusion.  You develop redness or irritation at the intravenous (IV) site. SEEK IMMEDIATE MEDICAL CARE IF:  Any of the following symptoms occur over the next 12 hours:  Shaking chills.  You have a temperature by mouth above 102 F (38.9 C), not controlled by medicine.  Chest, back, or muscle pain.  People around you feel you are not acting correctly or are confused.  Shortness of breath or difficulty breathing.  Dizziness and fainting.  You get a rash or develop hives.  You have a decrease in urine output.  Your urine turns a dark color or changes to pink, red, or brown. Any of the following symptoms occur over the next 10 days:  You have a temperature by mouth above 102 F (38.9 C), not controlled by medicine.  Shortness of breath.  Weakness after normal activity.  The white part of the eye turns yellow (jaundice).  You have a decrease in the amount of urine or are urinating less often.  Your urine turns a dark color or changes to pink, red, or brown. Document Released: 12/11/2000 Document Revised: 03/07/2012 Document Reviewed: 07/30/2008 [facility] Patient Information 2014 [place].

## 2014-03-28 ENCOUNTER — Ambulatory Visit (HOSPITAL_COMMUNITY)
Admission: RE | Admit: 2014-03-28 | Discharge: 2014-03-28 | Disposition: A | Discharge status: Home / Self Care | Payer: Medicare Other | Source: Ambulatory Visit | Attending: Orthopedic Surgery | Admitting: Orthopedic Surgery

## 2014-03-28 ENCOUNTER — Encounter (HOSPITAL_COMMUNITY): Payer: Self-pay

## 2014-03-28 ENCOUNTER — Encounter (HOSPITAL_COMMUNITY)
Admission: RE | Admit: 2014-03-28 | Discharge: 2014-03-28 | Disposition: A | Discharge status: Home / Self Care | Payer: Medicare Other | Source: Ambulatory Visit | Attending: Orthopedic Surgery | Admitting: Orthopedic Surgery

## 2014-03-28 DIAGNOSIS — Z01810 Encounter for preprocedural cardiovascular examination: Secondary | ICD-10-CM | POA: Insufficient documentation

## 2014-03-28 DIAGNOSIS — Z01818 Encounter for other preprocedural examination: Secondary | ICD-10-CM | POA: Insufficient documentation

## 2014-03-28 DIAGNOSIS — Z01812 Encounter for preprocedural laboratory examination: Secondary | ICD-10-CM | POA: Insufficient documentation

## 2014-03-28 HISTORY — DX: Unspecified osteoarthritis, unspecified site: M19.90

## 2014-03-28 HISTORY — DX: Essential (primary) hypertension: I10

## 2014-03-28 LAB — URINALYSIS, ROUTINE W REFLEX MICROSCOPIC
Bilirubin Urine: NEGATIVE
Glucose, UA: NEGATIVE mg/dL
Hgb urine dipstick: NEGATIVE
Ketones, ur: NEGATIVE mg/dL
Nitrite: NEGATIVE
Protein, ur: NEGATIVE mg/dL
Specific Gravity, Urine: 1.02 (ref 1.005–1.030)
Urobilinogen, UA: 0.2 mg/dL (ref 0.0–1.0)
pH: 5.5 (ref 5.0–8.0)

## 2014-03-28 LAB — SURGICAL PCR SCREEN
MRSA, PCR: INVALID — AB
Staphylococcus aureus: INVALID — AB

## 2014-03-28 LAB — COMPREHENSIVE METABOLIC PANEL
ALT: 14 U/L (ref 0–35)
AST: 20 U/L (ref 0–37)
Albumin: 3.9 g/dL (ref 3.5–5.2)
Alkaline Phosphatase: 62 U/L (ref 39–117)
BUN: 21 mg/dL (ref 6–23)
CO2: 25 mEq/L (ref 19–32)
Calcium: 9.7 mg/dL (ref 8.4–10.5)
Chloride: 102 mEq/L (ref 96–112)
Creatinine, Ser: 0.84 mg/dL (ref 0.50–1.10)
GFR calc Af Amer: 78 mL/min — ABNORMAL LOW (ref >90)
GFR calc non Af Amer: 67 mL/min — ABNORMAL LOW (ref >90)
Glucose, Bld: 97 mg/dL (ref 70–99)
Potassium: 4.1 mEq/L (ref 3.7–5.3)
Sodium: 140 mEq/L (ref 137–147)
Total Bilirubin: 0.4 mg/dL (ref 0.3–1.2)
Total Protein: 6.8 g/dL (ref 6.0–8.3)

## 2014-03-28 LAB — APTT: aPTT: 33 seconds (ref 24–37)

## 2014-03-28 LAB — URINE MICROSCOPIC-ADD ON

## 2014-03-28 LAB — CBC
HCT: 38 % (ref 36.0–46.0)
Hemoglobin: 13.1 g/dL (ref 12.0–15.0)
MCH: 28.6 pg (ref 26.0–34.0)
MCHC: 34.5 g/dL (ref 30.0–36.0)
MCV: 83 fL (ref 78.0–100.0)
Platelets: 231 10*3/uL (ref 150–400)
RBC: 4.58 MIL/uL (ref 3.87–5.11)
RDW: 13.2 % (ref 11.5–15.5)
WBC: 4.2 10*3/uL (ref 4.0–10.5)

## 2014-03-28 LAB — PROTIME-INR
INR: 1.04 (ref 0.00–1.49)
Prothrombin Time: 13.4 seconds (ref 11.6–15.2)

## 2014-03-28 NOTE — Progress Notes (Signed)
03/07/2014-Pre-op clearance from Dr.Ross on chart.

## 2014-04-02 ENCOUNTER — Inpatient Hospital Stay (HOSPITAL_COMMUNITY): Admission: RE | Admit: 2014-04-02 | Payer: Medicare Other | Source: Ambulatory Visit

## 2014-04-02 LAB — SURGICAL PCR SCREEN
MRSA, PCR: INVALID — AB
Staphylococcus aureus: INVALID — AB

## 2014-04-05 LAB — MRSA CULTURE

## 2014-04-09 ENCOUNTER — Encounter (HOSPITAL_COMMUNITY): Payer: Self-pay | Admitting: *Deleted

## 2014-04-09 ENCOUNTER — Inpatient Hospital Stay (HOSPITAL_COMMUNITY): Payer: Medicare Other | Admitting: Anesthesiology

## 2014-04-09 ENCOUNTER — Inpatient Hospital Stay (HOSPITAL_COMMUNITY)
Admission: RE | Admit: 2014-04-09 | Discharge: 2014-04-11 | DRG: 470 | Disposition: A | Discharge status: Home Health | Payer: Medicare Other | Source: Ambulatory Visit | Attending: Orthopedic Surgery | Admitting: Orthopedic Surgery

## 2014-04-09 ENCOUNTER — Encounter (HOSPITAL_COMMUNITY)
Admission: RE | Disposition: A | Discharge status: Home Health | Payer: Self-pay | Source: Ambulatory Visit | Attending: Orthopedic Surgery

## 2014-04-09 ENCOUNTER — Encounter (HOSPITAL_COMMUNITY): Payer: Medicare Other | Admitting: Anesthesiology

## 2014-04-09 DIAGNOSIS — I10 Essential (primary) hypertension: Secondary | ICD-10-CM | POA: Diagnosis present

## 2014-04-09 DIAGNOSIS — D62 Acute posthemorrhagic anemia: Secondary | ICD-10-CM | POA: Diagnosis not present

## 2014-04-09 DIAGNOSIS — Z8262 Family history of osteoporosis: Secondary | ICD-10-CM | POA: Diagnosis not present

## 2014-04-09 DIAGNOSIS — M1710 Unilateral primary osteoarthritis, unspecified knee: Principal | ICD-10-CM | POA: Diagnosis present

## 2014-04-09 DIAGNOSIS — Z6832 Body mass index (BMI) 32.0-32.9, adult: Secondary | ICD-10-CM | POA: Diagnosis not present

## 2014-04-09 DIAGNOSIS — Z96651 Presence of right artificial knee joint: Secondary | ICD-10-CM

## 2014-04-09 DIAGNOSIS — Z79899 Other long term (current) drug therapy: Secondary | ICD-10-CM

## 2014-04-09 DIAGNOSIS — Z8249 Family history of ischemic heart disease and other diseases of the circulatory system: Secondary | ICD-10-CM | POA: Diagnosis not present

## 2014-04-09 DIAGNOSIS — Z87891 Personal history of nicotine dependence: Secondary | ICD-10-CM | POA: Diagnosis not present

## 2014-04-09 DIAGNOSIS — E7800 Pure hypercholesterolemia, unspecified: Secondary | ICD-10-CM | POA: Diagnosis not present

## 2014-04-09 DIAGNOSIS — Z823 Family history of stroke: Secondary | ICD-10-CM

## 2014-04-09 DIAGNOSIS — IMO0002 Reserved for concepts with insufficient information to code with codable children: Secondary | ICD-10-CM | POA: Diagnosis not present

## 2014-04-09 DIAGNOSIS — M179 Osteoarthritis of knee, unspecified: Secondary | ICD-10-CM | POA: Diagnosis present

## 2014-04-09 HISTORY — PX: TOTAL KNEE ARTHROPLASTY: SHX125

## 2014-04-09 LAB — TYPE AND SCREEN
ABO/RH(D): O POS
Antibody Screen: NEGATIVE

## 2014-04-09 LAB — ABO/RH: ABO/RH(D): O POS

## 2014-04-09 SURGERY — ARTHROPLASTY, KNEE, TOTAL
Anesthesia: Spinal | Site: Knee | Laterality: Right

## 2014-04-09 MED ORDER — SODIUM CHLORIDE 0.9 % IV SOLN
10.0000 mg | INTRAVENOUS | Status: DC | PRN
  Administered 2014-04-09: 10 ug/min via INTRAVENOUS

## 2014-04-09 MED ORDER — DEXAMETHASONE 6 MG PO TABS
10.0000 mg | ORAL_TABLET | Freq: Every day | ORAL | Status: AC
  Administered 2014-04-10: 10 mg via ORAL
  Filled 2014-04-09: qty 1

## 2014-04-09 MED ORDER — ONDANSETRON HCL 4 MG PO TABS: 4.0000 mg | ORAL_TABLET | Freq: Four times a day (QID) | ORAL | Status: DC | PRN

## 2014-04-09 MED ORDER — ACETAMINOPHEN 500 MG PO TABS
1000.0000 mg | ORAL_TABLET | Freq: Four times a day (QID) | ORAL | Status: AC
  Administered 2014-04-09 – 2014-04-10 (×4): 1000 mg via ORAL
  Filled 2014-04-09 (×4): qty 2

## 2014-04-09 MED ORDER — DOCUSATE SODIUM 100 MG PO CAPS
100.0000 mg | ORAL_CAPSULE | Freq: Two times a day (BID) | ORAL | Status: DC
  Administered 2014-04-09 – 2014-04-11 (×4): 100 mg via ORAL

## 2014-04-09 MED ORDER — BUPIVACAINE HCL 0.25 % IJ SOLN
INTRAMUSCULAR | Status: DC | PRN
  Administered 2014-04-09: 20 mL

## 2014-04-09 MED ORDER — DEXTROSE-NACL 5-0.9 % IV SOLN
INTRAVENOUS | Status: DC
  Administered 2014-04-09: 16:00:00 via INTRAVENOUS

## 2014-04-09 MED ORDER — MIDAZOLAM HCL 2 MG/2ML IJ SOLN
INTRAMUSCULAR | Status: AC
  Filled 2014-04-09: qty 2

## 2014-04-09 MED ORDER — TRANEXAMIC ACID 100 MG/ML IV SOLN
1000.0000 mg | INTRAVENOUS | Status: AC
  Administered 2014-04-09: 1000 mg via INTRAVENOUS
  Filled 2014-04-09: qty 10

## 2014-04-09 MED ORDER — SODIUM CHLORIDE 0.9 % IV SOLN: INTRAVENOUS | Status: DC

## 2014-04-09 MED ORDER — ACETAMINOPHEN 325 MG PO TABS: 650.0000 mg | ORAL_TABLET | Freq: Four times a day (QID) | ORAL | Status: DC | PRN

## 2014-04-09 MED ORDER — FENTANYL CITRATE 0.05 MG/ML IJ SOLN
INTRAMUSCULAR | Status: DC | PRN
  Administered 2014-04-09: 100 ug via INTRAVENOUS

## 2014-04-09 MED ORDER — CEFAZOLIN SODIUM-DEXTROSE 2-3 GM-% IV SOLR
INTRAVENOUS | Status: AC
  Filled 2014-04-09: qty 50

## 2014-04-09 MED ORDER — DEXAMETHASONE SODIUM PHOSPHATE 10 MG/ML IJ SOLN
INTRAMUSCULAR | Status: AC
  Filled 2014-04-09: qty 1

## 2014-04-09 MED ORDER — KETOROLAC TROMETHAMINE 15 MG/ML IJ SOLN: 7.5000 mg | Freq: Four times a day (QID) | INTRAMUSCULAR | Status: AC | PRN

## 2014-04-09 MED ORDER — TRAMADOL HCL 50 MG PO TABS: 50.0000 mg | ORAL_TABLET | Freq: Four times a day (QID) | ORAL | Status: DC | PRN

## 2014-04-09 MED ORDER — DEXAMETHASONE SODIUM PHOSPHATE 10 MG/ML IJ SOLN
10.0000 mg | Freq: Once | INTRAMUSCULAR | Status: AC
  Administered 2014-04-09: 10 mg via INTRAVENOUS

## 2014-04-09 MED ORDER — MENTHOL 3 MG MT LOZG: 1.0000 | LOZENGE | OROMUCOSAL | Status: DC | PRN

## 2014-04-09 MED ORDER — PROMETHAZINE HCL 25 MG/ML IJ SOLN: 6.2500 mg | INTRAMUSCULAR | Status: DC | PRN

## 2014-04-09 MED ORDER — ONDANSETRON HCL 4 MG/2ML IJ SOLN: 4.0000 mg | Freq: Four times a day (QID) | INTRAMUSCULAR | Status: DC | PRN

## 2014-04-09 MED ORDER — BISACODYL 10 MG RE SUPP: 10.0000 mg | Freq: Every day | RECTAL | Status: DC | PRN

## 2014-04-09 MED ORDER — PROPOFOL INFUSION 10 MG/ML OPTIME
INTRAVENOUS | Status: DC | PRN
  Administered 2014-04-09: 50 ug/kg/min via INTRAVENOUS

## 2014-04-09 MED ORDER — FENTANYL CITRATE 0.05 MG/ML IJ SOLN
INTRAMUSCULAR | Status: AC
  Filled 2014-04-09: qty 2

## 2014-04-09 MED ORDER — BUPIVACAINE LIPOSOME 1.3 % IJ SUSP
20.0000 mL | Freq: Once | INTRAMUSCULAR | Status: DC
  Filled 2014-04-09: qty 20

## 2014-04-09 MED ORDER — PHENYLEPHRINE HCL 10 MG/ML IJ SOLN
INTRAMUSCULAR | Status: DC | PRN
  Administered 2014-04-09 (×3): 80 ug via INTRAVENOUS

## 2014-04-09 MED ORDER — PROPOFOL 10 MG/ML IV BOLUS
INTRAVENOUS | Status: AC
  Filled 2014-04-09: qty 20

## 2014-04-09 MED ORDER — CEFAZOLIN SODIUM-DEXTROSE 2-3 GM-% IV SOLR
2.0000 g | INTRAVENOUS | Status: AC
  Administered 2014-04-09: 2 g via INTRAVENOUS

## 2014-04-09 MED ORDER — OXYCODONE HCL 5 MG PO TABS
5.0000 mg | ORAL_TABLET | ORAL | Status: DC | PRN
  Administered 2014-04-09: 10 mg via ORAL
  Administered 2014-04-10 (×3): 5 mg via ORAL
  Administered 2014-04-11 (×2): 10 mg via ORAL
  Administered 2014-04-11: 5 mg via ORAL
  Filled 2014-04-09 (×4): qty 1
  Filled 2014-04-09 (×2): qty 2
  Filled 2014-04-09 (×2): qty 1

## 2014-04-09 MED ORDER — ACETAMINOPHEN 650 MG RE SUPP: 650.0000 mg | Freq: Four times a day (QID) | RECTAL | Status: DC | PRN

## 2014-04-09 MED ORDER — BUPIVACAINE-EPINEPHRINE PF 0.25-1:200000 % IJ SOLN
INTRAMUSCULAR | Status: AC
  Filled 2014-04-09: qty 30

## 2014-04-09 MED ORDER — DEXAMETHASONE SODIUM PHOSPHATE 10 MG/ML IJ SOLN
10.0000 mg | Freq: Every day | INTRAMUSCULAR | Status: AC
  Filled 2014-04-09: qty 1

## 2014-04-09 MED ORDER — 0.9 % SODIUM CHLORIDE (POUR BTL) OPTIME
TOPICAL | Status: DC | PRN
  Administered 2014-04-09: 1000 mL

## 2014-04-09 MED ORDER — METHOCARBAMOL 500 MG PO TABS
500.0000 mg | ORAL_TABLET | Freq: Four times a day (QID) | ORAL | Status: DC | PRN
  Administered 2014-04-10 (×3): 500 mg via ORAL
  Filled 2014-04-09 (×4): qty 1

## 2014-04-09 MED ORDER — HYDROMORPHONE HCL PF 1 MG/ML IJ SOLN: 0.2500 mg | INTRAMUSCULAR | Status: DC | PRN

## 2014-04-09 MED ORDER — DIPHENHYDRAMINE HCL 12.5 MG/5ML PO ELIX: 12.5000 mg | ORAL_SOLUTION | ORAL | Status: DC | PRN

## 2014-04-09 MED ORDER — LACTATED RINGERS IV SOLN
INTRAVENOUS | Status: DC
  Administered 2014-04-09: 1000 mL via INTRAVENOUS
  Administered 2014-04-09: 10:00:00 via INTRAVENOUS

## 2014-04-09 MED ORDER — PHENOL 1.4 % MT LIQD: 1.0000 | OROMUCOSAL | Status: DC | PRN

## 2014-04-09 MED ORDER — ACETAMINOPHEN 10 MG/ML IV SOLN
1000.0000 mg | Freq: Once | INTRAVENOUS | Status: AC
  Administered 2014-04-09: 1000 mg via INTRAVENOUS
  Filled 2014-04-09: qty 100

## 2014-04-09 MED ORDER — OLMESARTAN MEDOXOMIL-HCTZ 20-12.5 MG PO TABS: 1.0000 | ORAL_TABLET | Freq: Every morning | ORAL | Status: DC

## 2014-04-09 MED ORDER — BUPIVACAINE LIPOSOME 1.3 % IJ SUSP
INTRAMUSCULAR | Status: DC | PRN
  Administered 2014-04-09: 20 mL

## 2014-04-09 MED ORDER — HYDROCHLOROTHIAZIDE 12.5 MG PO CAPS
12.5000 mg | ORAL_CAPSULE | Freq: Every day | ORAL | Status: DC
  Administered 2014-04-09 – 2014-04-10 (×2): 12.5 mg via ORAL
  Filled 2014-04-09 (×3): qty 1

## 2014-04-09 MED ORDER — SODIUM CHLORIDE 0.9 % IJ SOLN
INTRAMUSCULAR | Status: AC
  Filled 2014-04-09: qty 50

## 2014-04-09 MED ORDER — ATORVASTATIN CALCIUM 20 MG PO TABS
20.0000 mg | ORAL_TABLET | Freq: Every day | ORAL | Status: DC
  Administered 2014-04-09 – 2014-04-10 (×2): 20 mg via ORAL
  Filled 2014-04-09 (×3): qty 1

## 2014-04-09 MED ORDER — POLYETHYLENE GLYCOL 3350 17 G PO PACK: 17.0000 g | PACK | Freq: Every day | ORAL | Status: DC | PRN

## 2014-04-09 MED ORDER — PHENYLEPHRINE HCL 10 MG/ML IJ SOLN
INTRAMUSCULAR | Status: AC
  Filled 2014-04-09: qty 1

## 2014-04-09 MED ORDER — RIVAROXABAN 10 MG PO TABS
10.0000 mg | ORAL_TABLET | Freq: Every day | ORAL | Status: DC
  Administered 2014-04-10 – 2014-04-11 (×2): 10 mg via ORAL
  Filled 2014-04-09 (×3): qty 1

## 2014-04-09 MED ORDER — METOCLOPRAMIDE HCL 10 MG PO TABS: 5.0000 mg | ORAL_TABLET | Freq: Three times a day (TID) | ORAL | Status: DC | PRN

## 2014-04-09 MED ORDER — MIDAZOLAM HCL 5 MG/5ML IJ SOLN
INTRAMUSCULAR | Status: DC | PRN
  Administered 2014-04-09: 2 mg via INTRAVENOUS

## 2014-04-09 MED ORDER — CEFAZOLIN SODIUM-DEXTROSE 2-3 GM-% IV SOLR
2.0000 g | Freq: Four times a day (QID) | INTRAVENOUS | Status: AC
  Administered 2014-04-09 (×2): 2 g via INTRAVENOUS
  Filled 2014-04-09 (×2): qty 50

## 2014-04-09 MED ORDER — BUPIVACAINE HCL (PF) 0.75 % IJ SOLN
INTRAMUSCULAR | Status: DC | PRN
  Administered 2014-04-09: 15 mg via INTRATHECAL

## 2014-04-09 MED ORDER — METHOCARBAMOL 100 MG/ML IJ SOLN
500.0000 mg | Freq: Four times a day (QID) | INTRAMUSCULAR | Status: DC | PRN
  Administered 2014-04-09: 500 mg via INTRAVENOUS
  Filled 2014-04-09: qty 5

## 2014-04-09 MED ORDER — IRBESARTAN 150 MG PO TABS
150.0000 mg | ORAL_TABLET | Freq: Every day | ORAL | Status: DC
  Administered 2014-04-09: 150 mg via ORAL
  Filled 2014-04-09 (×2): qty 1

## 2014-04-09 MED ORDER — METOCLOPRAMIDE HCL 5 MG/ML IJ SOLN
5.0000 mg | Freq: Three times a day (TID) | INTRAMUSCULAR | Status: DC | PRN
Start: 2014-04-09 — End: 2014-04-11

## 2014-04-09 MED ORDER — MORPHINE SULFATE 2 MG/ML IJ SOLN
1.0000 mg | INTRAMUSCULAR | Status: DC | PRN
  Administered 2014-04-09: 2 mg via INTRAVENOUS
  Filled 2014-04-09: qty 1

## 2014-04-09 MED ORDER — SODIUM CHLORIDE 0.9 % IR SOLN
Status: DC | PRN
  Administered 2014-04-09: 1000 mL

## 2014-04-09 MED ORDER — SODIUM CHLORIDE 0.9 % IJ SOLN
INTRAMUSCULAR | Status: DC | PRN
  Administered 2014-04-09: 30 mL

## 2014-04-09 MED ORDER — FLEET ENEMA 7-19 GM/118ML RE ENEM: 1.0000 | ENEMA | Freq: Once | RECTAL | Status: AC | PRN

## 2014-04-09 SURGICAL SUPPLY — 62 items
BAG ZIPLOCK 12X15 (MISCELLANEOUS) ×2 IMPLANT
BANDAGE ELASTIC 6 VELCRO ST LF (GAUZE/BANDAGES/DRESSINGS) ×2 IMPLANT
BANDAGE ESMARK 6X9 LF (GAUZE/BANDAGES/DRESSINGS) ×1 IMPLANT
BLADE SAG 18X100X1.27 (BLADE) ×2 IMPLANT
BLADE SAW SGTL 11.0X1.19X90.0M (BLADE) ×2 IMPLANT
BNDG ESMARK 6X9 LF (GAUZE/BANDAGES/DRESSINGS) ×2
BOWL SMART MIX CTS (DISPOSABLE) ×2 IMPLANT
CAPT RP KNEE ×2 IMPLANT
CEMENT HV SMART SET (Cement) ×4 IMPLANT
CUFF TOURN SGL QUICK 34 (TOURNIQUET CUFF) ×1
CUFF TRNQT CYL 34X4X40X1 (TOURNIQUET CUFF) ×1 IMPLANT
DECANTER SPIKE VIAL GLASS SM (MISCELLANEOUS) ×2 IMPLANT
DRAPE EXTREMITY T 121X128X90 (DRAPE) ×2 IMPLANT
DRAPE POUCH INSTRU U-SHP 10X18 (DRAPES) ×2 IMPLANT
DRAPE U-SHAPE 47X51 STRL (DRAPES) ×2 IMPLANT
DRSG ADAPTIC 3X8 NADH LF (GAUZE/BANDAGES/DRESSINGS) ×2 IMPLANT
DRSG PAD ABDOMINAL 8X10 ST (GAUZE/BANDAGES/DRESSINGS) ×2 IMPLANT
DURAPREP 26ML APPLICATOR (WOUND CARE) ×2 IMPLANT
ELECT REM PT RETURN 9FT ADLT (ELECTROSURGICAL) ×2
ELECTRODE REM PT RTRN 9FT ADLT (ELECTROSURGICAL) ×1 IMPLANT
EVACUATOR 1/8 PVC DRAIN (DRAIN) ×2 IMPLANT
FACESHIELD WRAPAROUND (MASK) ×10 IMPLANT
GLOVE BIO SURGEON STRL SZ7.5 (GLOVE) IMPLANT
GLOVE BIO SURGEON STRL SZ8 (GLOVE) ×4 IMPLANT
GLOVE BIOGEL PI IND STRL 7.5 (GLOVE) ×1 IMPLANT
GLOVE BIOGEL PI IND STRL 8 (GLOVE) ×1 IMPLANT
GLOVE BIOGEL PI IND STRL 8.5 (GLOVE) ×1 IMPLANT
GLOVE BIOGEL PI INDICATOR 7.5 (GLOVE) ×1
GLOVE BIOGEL PI INDICATOR 8 (GLOVE) ×1
GLOVE BIOGEL PI INDICATOR 8.5 (GLOVE) ×1
GLOVE SURG SS PI 6.5 STRL IVOR (GLOVE) ×2 IMPLANT
GLOVE SURG SS PI 7.0 STRL IVOR (GLOVE) ×2 IMPLANT
GLOVE SURG SS PI 7.5 STRL IVOR (GLOVE) ×4 IMPLANT
GOWN STRL REUS TWL 2XL XL LVL4 (GOWN DISPOSABLE) ×2 IMPLANT
GOWN STRL REUS W/TWL LRG LVL3 (GOWN DISPOSABLE) ×6 IMPLANT
GOWN STRL REUS W/TWL XL LVL3 (GOWN DISPOSABLE) IMPLANT
HANDPIECE INTERPULSE COAX TIP (DISPOSABLE) ×1
IMMOBILIZER KNEE 20 (SOFTGOODS) ×2 IMPLANT
KIT BASIN OR (CUSTOM PROCEDURE TRAY) ×2 IMPLANT
MANIFOLD NEPTUNE II (INSTRUMENTS) ×2 IMPLANT
NDL SAFETY ECLIPSE 18X1.5 (NEEDLE) ×2 IMPLANT
NEEDLE HYPO 18GX1.5 SHARP (NEEDLE) ×2
NS IRRIG 1000ML POUR BTL (IV SOLUTION) ×2 IMPLANT
PACK TOTAL JOINT (CUSTOM PROCEDURE TRAY) ×2 IMPLANT
PAD ABD 8X10 STRL (GAUZE/BANDAGES/DRESSINGS) ×2 IMPLANT
PADDING CAST COTTON 6X4 STRL (CAST SUPPLIES) ×2 IMPLANT
POSITIONER SURGICAL ARM (MISCELLANEOUS) ×2 IMPLANT
SET HNDPC FAN SPRY TIP SCT (DISPOSABLE) ×1 IMPLANT
SPONGE GAUZE 4X4 12PLY (GAUZE/BANDAGES/DRESSINGS) ×2 IMPLANT
STRIP CLOSURE SKIN 1/2X4 (GAUZE/BANDAGES/DRESSINGS) ×4 IMPLANT
SUCTION FRAZIER 12FR DISP (SUCTIONS) ×2 IMPLANT
SUT MNCRL AB 4-0 PS2 18 (SUTURE) ×2 IMPLANT
SUT VIC AB 2-0 CT1 27 (SUTURE) ×3
SUT VIC AB 2-0 CT1 TAPERPNT 27 (SUTURE) ×3 IMPLANT
SUT VLOC 180 0 24IN GS25 (SUTURE) ×2 IMPLANT
SYR 20CC LL (SYRINGE) ×2 IMPLANT
SYR 50ML LL SCALE MARK (SYRINGE) ×2 IMPLANT
TOWEL OR 17X26 10 PK STRL BLUE (TOWEL DISPOSABLE) ×2 IMPLANT
TOWEL OR NON WOVEN STRL DISP B (DISPOSABLE) ×2 IMPLANT
TRAY FOLEY CATH 14FRSI W/METER (CATHETERS) ×2 IMPLANT
WATER STERILE IRR 1500ML POUR (IV SOLUTION) ×2 IMPLANT
WRAP KNEE MAXI GEL POST OP (GAUZE/BANDAGES/DRESSINGS) ×2 IMPLANT

## 2014-04-09 NOTE — Anesthesia Preprocedure Evaluation (Signed)
Anesthesia Evaluation  Patient identified by MRN, date of birth, ID band Patient awake    Reviewed: Allergy & Precautions, H&P , NPO status , Patient's Chart, lab work & pertinent test results  Airway Mallampati: II TM Distance: >3 FB Neck ROM: Full    Dental no notable dental hx.    Pulmonary former smoker,  breath sounds clear to auscultation  Pulmonary exam normal       Cardiovascular Exercise Tolerance: Good hypertension, Pt. on medications Rhythm:Regular Rate:Normal     Neuro/Psych negative neurological ROS  negative psych ROS   GI/Hepatic negative GI ROS, Neg liver ROS,   Endo/Other  negative endocrine ROS  Renal/GU negative Renal ROS  negative genitourinary   Musculoskeletal negative musculoskeletal ROS (+)   Abdominal (+) + obese,   Peds negative pediatric ROS (+)  Hematology negative hematology ROS (+)   Anesthesia Other Findings   Reproductive/Obstetrics negative OB ROS                           Anesthesia Physical Anesthesia Plan  ASA: II  Anesthesia Plan: Spinal   Post-op Pain Management:    Induction: Intravenous  Airway Management Planned:   Additional Equipment:   Intra-op Plan:   Post-operative Plan: Extubation in OR  Informed Consent: I have reviewed the patients History and Physical, chart, labs and discussed the procedure including the risks, benefits and alternatives for the proposed anesthesia with the patient or authorized representative who has indicated his/her understanding and acceptance.   Dental advisory given  Plan Discussed with: CRNA  Anesthesia Plan Comments: (Discussed general versus spinal. Discussed risks/benefits of spinal including headache, backache, failure, bleeding, infection, and nerve damage. Patient consents to spinal. Questions answered. Coagulation studies and platelet count acceptable.)        Anesthesia Quick  Evaluation

## 2014-04-09 NOTE — Evaluation (Signed)
Physical Therapy Evaluation Patient Details Name: Beverly Moreno MRN: [id number] DOB: Mar 03, 1941 Today's Date: 04/09/2014   History of Present Illness  RTKA  Clinical Impression  Pt dizzy upon sitting and standing. Tolerated  Few steps to recliner. BP 151/76 , Pt will benefit from PT while in acute to address problems listed below.    Follow Up Recommendations Home health PT    Equipment Recommendations  Rolling walker with 5" wheels    Recommendations for Other Services       Precautions / Restrictions Precautions Precautions: Knee;Fall Required Braces or Orthoses: Knee Immobilizer - Right      Mobility  Bed Mobility Overal bed mobility: Needs Assistance Bed Mobility: Supine to Sit     Supine to sit: Min assist     General bed mobility comments: cues for technique  Transfers Overall transfer level: Needs assistance Equipment used: Rolling walker (2 wheeled) Transfers: Sit to/from [employer] Transfers Sit to Stand: +2 safety/equipment;Min assist         General transfer comment: pt felt dizzy, assisted to recliner, cues for hand placement  Ambulation/Gait Ambulation/Gait assistance: +2 safety/equipment;Min assist Ambulation Distance (Feet): 5 Feet Assistive device: Rolling walker (2 wheeled) Gait Pattern/deviations: Step-to pattern;Antalgic;Decreased step length - right     General Gait Details: cues for sequence  Stairs            Wheelchair Mobility    Modified Rankin (Stroke Patients Only)       Balance                                             Pertinent Vitals/Pain Pain is < 3    Home Living Family/patient expects to be discharged to:: Private residence   Available Help at Discharge: Family Type of Home: House Home Access: Stairs to enter Entrance Stairs-Rails: None Technical brewer of Steps: 2 Home Layout: One level Home Equipment: None      Prior Function Level of Independence: Independent                Hand Dominance        Extremity/Trunk Assessment   Upper Extremity Assessment: Overall WFL for tasks assessed           Lower Extremity Assessment: RLE deficits/detail RLE Deficits / Details: able to perform  slr       Communication   Communication: No difficulties  Cognition Arousal/Alertness: Awake/alert Behavior During Therapy: WFL for tasks assessed/performed Overall Cognitive Status: Within Functional Limits for tasks assessed                      General Comments      Exercises        Assessment/Plan    PT Assessment Patient needs continued PT services  PT Diagnosis Difficulty walking;Acute pain   PT Problem List Decreased strength;Decreased range of motion;Decreased activity tolerance;Decreased mobility;Decreased knowledge of precautions;Decreased safety awareness;Decreased knowledge of use of DME;Pain  PT Treatment Interventions DME instruction;Gait training;Stair training;Functional mobility training;Therapeutic activities;Therapeutic exercise;Patient/family education   PT Goals (Current goals can be found in the Care Plan section) Acute Rehab PT Goals Patient Stated Goal: to walk today PT Goal Formulation: With patient/family Time For Goal Achievement: 04/14/14 Potential to Achieve Goals: Good    Frequency 7X/week   Barriers to discharge        Co-evaluation  End of Session Equipment Utilized During Treatment: Gait belt Activity Tolerance: Treatment limited secondary to medical complications (Comment) (dizziness) Patient left: with call bell/phone within reach;with family/visitor present Nurse Communication: Mobility status         Time: 6837-2902 PT Time Calculation (min): 15 min   Charges:   PT Evaluation $Initial PT Evaluation Tier I: 1 Procedure PT Treatments $Gait Training: 8-22 mins   PT G Codes:          [name] 04/09/2014, 6:13 PM Tresa Endo PT [phone]

## 2014-04-09 NOTE — H&P (View-Only) (Signed)
Beverly Moreno. Culliver DOB: 1941/07/21 Married / Language: English / Race: White Female  Date of Admission: 04-09-2014  Chief Complaint:  Right Knee Pain  History of Present Illness The patient is a 73 year old female who comes in for a preoperative History and Physical. The patient is scheduled for a right total knee arthroplasty to be performed by Dr. Dione Plover. Aluisio, MD at [facility] on 04-18-2014. The patient is a 73 year old female who presents for follow up of their knee. The patient is being followed for their right osteoarthritis. They are now 7 week(s) out from Syvisc series . Symptoms reported today include: pain (lateral as well as posterior ), swelling and grinding. The patient feels that they are doing poorly. Current treatment includes: NSAIDs (Ibuprofen 800mg ) and water aerobics. The following medication has been used for pain control: none. The patient has not gotten any relief of their symptoms with viscosupplementation. She states that the knee is bothering her at all times. It has gotten progressively worse over the past year or so. It is hurting at all times and limiting what she can and can not do. The visco supplement injections did not help this time. She is now ready to proceed with knee surgery. They have been treated conservatively in the past for the above stated problem and despite conservative measures, they continue to have progressive pain and severe functional limitations and dysfunction. They have failed non-operative management including home exercise, medications, and injections. It is felt that they would benefit from undergoing total joint replacement. Risks and benefits of the procedure have been discussed with the patient and they elect to proceed with surgery. There are no active contraindications to surgery such as ongoing infection or rapidly progressive neurological disease.  Allergies No Known Drug Allergies   Problem List/Past  Medical Pain, Lumbar (LBP) (724.2) Knee pain (719.46) Shoulder instability (718.81) Osteoarthritis, Lumbar (715.98) Primary osteoarthritis of one knee (715.16) High blood pressure Osteoarthritis Osteoporosis Hemorrhoids Hypercholesterolemia Varicose veins Menopause   Family History Osteoporosis. mother and sister Osteoarthritis. mother and sister Hypertension. mother and father Cancer. sister grandmother mothers side Heart Disease. First Degree Relatives. mother Cerebrovascular Accident. First Degree Relatives. father   Social History Previously in rehab. no Pain Contract. no Drug/Alcohol Rehab (Previously). no Tobacco / smoke exposure. no Number of flights of stairs before winded. 2-3 Illicit drug use. no Exercise. Exercises daily; does other Exercises daily; does running / walking and other Marital status. married Living situation. live with spouse Tobacco use. Former smoker. former smoker; smoke(d) 1 pack(s) per day former smoker; smoke(d) 1 1/2 pack(s) per day Children. 2 Alcohol use. current drinker; drinks wine; less than 5 per week Drug/Alcohol Rehab (Currently). no Current work status. retired Regulatory affairs officer. Healthcare POA   Medication History Benicar HCT (20-12.5MG  Tablet, Oral) Active. Lipitor (20MG  Tablet, Oral) Active. Ocuvite Adult Formula ( Oral) Active. Biotin Forte (3MG  Tablet, Oral) Active. Calcium Citrate + ( Oral) Active. Omega 3 ( Oral) Specific dose unknown - Active.   Past Surgical History Cataract Surgery. bilateral Tonsillectomy Hysterectomy. Date: 63. complete (non-cancerous) Appendectomy Rotator Cuff Repair. Date: 2002. right   Review of Systems General:Not Present- Chills, Fever, Night Sweats, Fatigue, Weight Gain, Weight Loss and Memory Loss. Skin:Not Present- Hives, Itching, Rash, Eczema and Lesions. HEENT:Not Present- Tinnitus, Headache, Double Vision, Visual Loss, Hearing Loss  and Dentures. Respiratory:Not Present- Shortness of breath with exertion, Shortness of breath at rest, Allergies, Coughing up blood and Chronic Cough. Cardiovascular:Not Present- Chest Pain, Racing/skipping  heartbeats, Difficulty Breathing Lying Down, Murmur, Swelling and Palpitations. Gastrointestinal:Present- Constipation. Not Present- Bloody Stool, Heartburn, Abdominal Pain, Vomiting, Nausea, Diarrhea, Difficulty Swallowing, Jaundice and Loss of appetitie. Female Genitourinary:Present- Urinating at Night. Not Present- Blood in Urine, Urinary frequency, Weak urinary stream, Discharge, Flank Pain, Incontinence, Painful Urination, Urgency and Urinary Retention. Musculoskeletal:Present- Joint Pain and Morning Stiffness. Not Present- Muscle Weakness, Muscle Pain, Joint Swelling, Back Pain and Spasms. Neurological:Not Present- Tremor, Dizziness, Blackout spells, Paralysis, Difficulty with balance and Weakness. Psychiatric:Not Present- Insomnia.    Vitals Pulse: 58 (Regular) Resp.: 16 (Unlabored) BP: 145/72 (Sitting, Right Arm, Standard)     Physical Exam The physical exam findings are as follows:   General Mental Status - Alert, cooperative and good historian. General Appearance- pleasant. Not in acute distress. Orientation- Oriented X3. Build & Nutrition- Well nourished and Well developed.   Head and Neck Head- normocephalic, atraumatic . Neck Global Assessment- supple. no bruit auscultated on the right and no bruit auscultated on the left.   Eye Pupil- Bilateral- Regular and Round. Motion- Bilateral- EOMI.   Chest and Lung Exam Auscultation: Breath sounds:- clear at anterior chest wall and - clear at posterior chest wall. Adventitious sounds:- No Adventitious sounds.   Cardiovascular Auscultation:Rhythm- Regular rate and rhythm. Heart Sounds- S1 WNL and S2 WNL. Murmurs & Other Heart Sounds: Murmur 1:Location- Aortic Area.  Timing- Mid-systolic. Grade- II/VI.   Abdomen Palpation/Percussion:Tenderness- Abdomen is non-tender to palpation. Rigidity (guarding)- Abdomen is soft. Auscultation:Auscultation of the abdomen reveals - Bowel sounds normal.   Female Genitourinary Not done, not pertinent to present illness  Musculoskeletal On exam she is a well developed female alert and oriented in no apparent distress. The right knee shows no effusion. There is marked crepitus on range of motion of the knee. Range is about 5 to 125. She is tender medial greater than lateral with no instability noted.  RADIOGRAPHS: AP both knees and lateral of the right show she has advanced arthritis now. She is bone on bone in the medial compartment with patellofemoral bone on bone also. She has large osteophytes present.   Assessment & Plan Primary osteoarthritis of one knee (715.16) Impression: Right Knee  Note: Plan is for a Right Total Knee Replacement by Dr. Wynelle Link.  Plan is to go home with husband  PCP - Dr. Myriam Jacobson - Patient has been seen preoperatively and felt to be stable for surgery.  The patient does not have any contraindications and will receive TXA (tranexamic acid) prior to surgery.  Signed electronically by Joelene Millin, III PA-C

## 2014-04-09 NOTE — Anesthesia Procedure Notes (Signed)
Spinal  Patient location during procedure: OR Start time: 04/09/2014 9:17 AM End time: 04/09/2014 9:22 AM Staffing CRNA/Resident: Harle Stanford R Performed by: resident/CRNA  Preanesthetic Checklist Completed: patient identified, site marked, surgical consent, pre-op evaluation, timeout performed, IV checked, risks and benefits discussed and monitors and equipment checked Spinal Block Patient position: sitting Prep: Betadine Patient monitoring: heart rate, cardiac monitor, continuous pulse ox and blood pressure Approach: midline Location: L3-4 Injection technique: single-shot Needle Needle type: Sprotte  Needle gauge: 24 G Needle length: 9 cm Needle insertion depth: 7 cm Assessment Sensory level: T6

## 2014-04-09 NOTE — Progress Notes (Signed)
Utilization review completed.  

## 2014-04-09 NOTE — Anesthesia Postprocedure Evaluation (Signed)
  Anesthesia Post-op Note  Patient: Beverly Moreno  Procedure(s) Performed: Procedure(s) (LRB): RIGHT TOTAL KNEE ARTHROPLASTY (Right)  Patient Location: PACU  Anesthesia Type: Spinal  Level of Consciousness: awake and alert   Airway and Oxygen Therapy: Patient Spontanous Breathing  Post-op Pain: mild  Post-op Assessment: Post-op Vital signs reviewed, Patient's Cardiovascular Status Stable, Respiratory Function Stable, Patent Airway and No signs of Nausea or vomiting  Last Vitals:  Filed Vitals:   04/09/14 1520  BP: 143/73  Pulse: 55  Temp: 36.6 C  Resp: 16    Post-op Vital Signs: stable   Complications: No apparent anesthesia complications

## 2014-04-09 NOTE — Transfer of Care (Signed)
Immediate Anesthesia Transfer of Care Note  Patient: Beverly Moreno  Procedure(s) Performed: Procedure(s): RIGHT TOTAL KNEE ARTHROPLASTY (Right)  Patient Location: PACU  Anesthesia Type:Spinal  Level of Consciousness: sedated  Airway & Oxygen Therapy: Patient Spontanous Breathing and Patient connected to face mask oxygen  Post-op Assessment: Report given to PACU RN and Post -op Vital signs reviewed and stable  Post vital signs: Reviewed and stable  Complications: No apparent anesthesia complications and Patient re-intubated

## 2014-04-09 NOTE — Op Note (Signed)
Pre-operative diagnosis- Osteoarthritis  Right knee(s)  Post-operative diagnosis- Osteoarthritis Right knee(s)  Procedure-  Right  Total Knee Arthroplasty  Surgeon- Dione Plover. Deshawna Mcneece, MD  Assistant- Ardeen Jourdain, PA-C   Anesthesia-  Spinal EBL-* No blood loss amount entered *  Drains Hemovac  Tourniquet time-  Total Tourniquet Time Documented: Thigh (Right) - 34 minutes Total: Thigh (Right) - 34 minutes    Complications- None  Condition-PACU - hemodynamically stable.   Brief Clinical Note  Beverly Moreno is a 73 y.o. year old female with end stage OA of her right knee with progressively worsening pain and dysfunction. She has constant pain, with activity and at rest and significant functional deficits with difficulties even with ADLs. She has had extensive non-op management including analgesics, injections of cortisone and viscosupplements, and home exercise program, but remains in significant pain with significant dysfunction.Radiographs show bone on bone arthritis medial and patellofemoral. She presents now for right Total Knee Arthroplasty.    Procedure in detail---   The patient is brought into the operating room and positioned supine on the operating table. After successful administration of  Spinal,   a tourniquet is placed high on the  Right thigh(s) and the lower extremity is prepped and draped in the usual sterile fashion. Time out is performed by the operating team and then the  Right lower extremity is wrapped in Esmarch, knee flexed and the tourniquet inflated to 300 mmHg.       A midline incision is made with a ten blade through the subcutaneous tissue to the level of the extensor mechanism. A fresh blade is used to make a medial parapatellar arthrotomy. Soft tissue over the proximal medial tibia is subperiosteally elevated to the joint line with a knife and into the semimembranosus bursa with a Cobb elevator. Soft tissue over the proximal lateral tibia is elevated with  attention being paid to avoiding the patellar tendon on the tibial tubercle. The patella is everted, knee flexed 90 degrees and the ACL and PCL are removed. Findings are bone on bone medial and patellofemoral with large medial osteophytes.        The drill is used to create a starting hole in the distal femur and the canal is thoroughly irrigated with sterile saline to remove the fatty contents. The 5 degree Right  valgus alignment guide is placed into the femoral canal and the distal femoral cutting block is pinned to remove 10 mm off the distal femur. Resection is made with an oscillating saw.      The tibia is subluxed forward and the menisci are removed. The extramedullary alignment guide is placed referencing proximally at the medial aspect of the tibial tubercle and distally along the second metatarsal axis and tibial crest. The block is pinned to remove 52mm off the more deficient medial  side. Resection is made with an oscillating saw. Size 4 is the most appropriate size for the tibia and the proximal tibia is prepared with the modular drill and keel punch for that size.      The femoral sizing guide is placed and size 4 is most appropriate. Rotation is marked off the epicondylar axis and confirmed by creating a rectangular flexion gap at 90 degrees. The size 4 cutting block is pinned in this rotation and the anterior, posterior and chamfer cuts are made with the oscillating saw. The intercondylar block is then placed and that cut is made.      Trial size 4 tibial component, trial size 4 narrow  posterior stabilized femur and a 10  mm posterior stabilized rotating platform insert trial is placed. Full extension is achieved with excellent varus/valgus and anterior/posterior balance throughout full range of motion. The patella is everted and thickness measured to be 22  mm. Free hand resection is taken to 12 mm, a 35 template is placed, lug holes are drilled, trial patella is placed, and it tracks normally.  Osteophytes are removed off the posterior femur with the trial in place. All trials are removed and the cut bone surfaces prepared with pulsatile lavage. Cement is mixed and once ready for implantation, the size 4 tibial implant, size  4 narrow posterior stabilized femoral component, and the size 35 patella are cemented in place and the patella is held with the clamp. The trial insert is placed and the knee held in full extension. The Exparel (20 ml mixed with 30 ml saline) and .25% Bupivicaine, are injected into the extensor mechanism, posterior capsule, medial and lateral gutters and subcutaneous tissues.  All extruded cement is removed and once the cement is hard the permanent 10 mm posterior stabilized rotating platform insert is placed into the tibial tray.      The wound is copiously irrigated with saline solution and the extensor mechanism closed over a hemovac drain with #1 PDS suture. The tourniquet is released for a total tourniquet time of 34  minutes. Flexion against gravity is 140 degrees and the patella tracks normally. Subcutaneous tissue is closed with 2.0 vicryl and subcuticular with running 4.0 Monocryl. The incision is cleaned and dried and steri-strips and a bulky sterile dressing are applied. The limb is placed into a knee immobilizer and the patient is awakened and transported to recovery in stable condition.      Please note that a surgical assistant was a medical necessity for this procedure in order to perform it in a safe and expeditious manner. Surgical assistant was necessary to retract the ligaments and vital neurovascular structures to prevent injury to them and also necessary for proper positioning of the limb to allow for anatomic placement of the prosthesis.   [name], MD    04/09/2014, 10:26 AM

## 2014-04-09 NOTE — Interval H&P Note (Signed)
History and Physical Interval Note:  9/38/1829 9:37 AM  Beverly Moreno  has presented today for surgery, with the diagnosis of OA RIGHT KNEE   The various methods of treatment have been discussed with the patient and family. After consideration of risks, benefits and other options for treatment, the patient has consented to  Procedure(s): RIGHT TOTAL KNEE ARTHROPLASTY (Right) as a surgical intervention .  The patient's history has been reviewed, patient examined, no change in status, stable for surgery.  I have reviewed the patient's chart and labs.  Questions were answered to the patient's satisfaction.     Dione Plover Dmitriy Gair

## 2014-04-10 ENCOUNTER — Encounter (HOSPITAL_COMMUNITY): Payer: Self-pay | Admitting: Orthopedic Surgery

## 2014-04-10 DIAGNOSIS — D62 Acute posthemorrhagic anemia: Secondary | ICD-10-CM | POA: Diagnosis not present

## 2014-04-10 DIAGNOSIS — I10 Essential (primary) hypertension: Secondary | ICD-10-CM | POA: Diagnosis present

## 2014-04-10 LAB — BASIC METABOLIC PANEL
BUN: 12 mg/dL (ref 6–23)
CO2: 24 mEq/L (ref 19–32)
Calcium: 8.9 mg/dL (ref 8.4–10.5)
Chloride: 102 mEq/L (ref 96–112)
Creatinine, Ser: 0.74 mg/dL (ref 0.50–1.10)
GFR calc Af Amer: >90 mL/min (ref >90)
GFR calc non Af Amer: 82 mL/min — ABNORMAL LOW (ref >90)
Glucose, Bld: 186 mg/dL — ABNORMAL HIGH (ref 70–99)
Potassium: 3.7 mEq/L (ref 3.7–5.3)
Sodium: 139 mEq/L (ref 137–147)

## 2014-04-10 LAB — CBC
HCT: 29.3 % — ABNORMAL LOW (ref 36.0–46.0)
Hemoglobin: 10.2 g/dL — ABNORMAL LOW (ref 12.0–15.0)
MCH: 28.7 pg (ref 26.0–34.0)
MCHC: 34.8 g/dL (ref 30.0–36.0)
MCV: 82.5 fL (ref 78.0–100.0)
Platelets: 206 10*3/uL (ref 150–400)
RBC: 3.55 MIL/uL — ABNORMAL LOW (ref 3.87–5.11)
RDW: 13.1 % (ref 11.5–15.5)
WBC: 10.7 10*3/uL — ABNORMAL HIGH (ref 4.0–10.5)

## 2014-04-10 MED ORDER — IRBESARTAN 150 MG PO TABS
150.0000 mg | ORAL_TABLET | Freq: Every day | ORAL | Status: DC
  Filled 2014-04-10 (×2): qty 1

## 2014-04-10 NOTE — Progress Notes (Signed)
Physical Therapy Treatment Patient Details Name: Beverly Moreno MRN: [id number] DOB: [birth year] Today's Date: 04/10/2014    History of Present Illness Pt is s/p R TKA with history of osteoporosis, HTN and lumbar pain.     PT Comments    Pt tolerated ambulation this PM with no nausea. Pt plans DC tomorrow.  Follow Up Recommendations  Home health PT     Equipment Recommendations  None recommended by PT    Recommendations for Other Services       Precautions / Restrictions Precautions Precautions: Knee;Fall Required Braces or Orthoses: Knee Immobilizer - Right    Mobility  Bed Mobility Overal bed mobility: Needs Assistance Bed Mobility: Sit to Supine       Sit to supine: Min assist   General bed mobility comments: asssit of R leg into bed.  Transfers Overall transfer level: Needs assistance Equipment used: Rolling walker (2 wheeled) Transfers: Sit to/from Stand Sit to Stand: Supervision         General transfer comment: verbal cues for hand placement and LE management.,supervision from bed  Ambulation/Gait Ambulation/Gait assistance: Min guard Ambulation Distance (Feet): 80 Feet Assistive device: Rolling walker (2 wheeled) Gait Pattern/deviations: Step-through pattern;Step-to pattern;Antalgic;Trunk flexed     General Gait Details: cues for sequence, became nauseated after return to room   Stairs            Wheelchair Mobility    Modified Rankin (Stroke Patients Only)       Balance                                    Cognition Arousal/Alertness: Awake/alert                          Exercises Total Joint Exercises Ankle Circles/Pumps: AROM;Both;10 reps;Supine Quad Sets: AROM;Both;Supine Short Arc Quad: AROM;Right;10 reps;Supine Heel Slides: AROM;Right;10 reps;Supine Hip ABduction/ADduction: AROM;Right;10 reps;Supine Straight Leg Raises: AAROM;Right;10 reps;Supine Goniometric ROM: 10-50    General Comments         Pertinent Vitals/Pain 3 R knee.    Home Living                      Prior Function            PT Goals (current goals can now be found in the care plan section) Progress towards PT goals: Progressing toward goals    Frequency  7X/week    PT Plan Current plan remains appropriate    Co-evaluation             End of Session Equipment Utilized During Treatment: Gait belt Activity Tolerance: Patient tolerated treatment well Patient left: in bed;with call bell/phone within reach;with family/visitor present     Time: 2706-2376 PT Time Calculation (min): 32 min  Charges:  $Gait Training: 8-22 mins $Therapeutic Exercise: 8-22 mins                    G Codes:      [name] 04/10/2014, 5:52 PM

## 2014-04-10 NOTE — Progress Notes (Signed)
Physical Therapy Treatment Patient Details Name: Beverly Moreno MRN: [id number] DOB: 1941-08-16 Today's Date: 04/10/2014    History of Present Illness Pt is s/p R TKA with history of osteoporosis, HTN and lumbar pain.     PT Comments    Pt ambulating well, nausea, slightly diaphoretic. BP 102/66, after rest 110/66.   Follow Up Recommendations  Home health PT     Equipment Recommendations  None recommended by PT    Recommendations for Other Services       Precautions / Restrictions Precautions Precautions: Knee;Fall Required Braces or Orthoses: Knee Immobilizer - Right Restrictions Weight Bearing Restrictions: No RLE Weight Bearing: Weight bearing as tolerated    Mobility  Bed Mobility Overal bed mobility: Needs Assistance Bed Mobility: Supine to Sit     Supine to sit: Supervision     General bed mobility comments: pt able to move her leg to the edge  Transfers Overall transfer level: Needs assistance Equipment used: Rolling walker (2 wheeled) Transfers: Sit to/from Stand Sit to Stand: Min guard;Supervision         General transfer comment: verbal cues for hand placement and LE management.,supervision from bed  Ambulation/Gait Ambulation/Gait assistance: Min guard Ambulation Distance (Feet): 20 Feet (then 100') Assistive device: Rolling walker (2 wheeled) Gait Pattern/deviations: Step-to pattern;Step-through pattern;Antalgic     General Gait Details: cues for sequence, became nauseated after return to room   Stairs            Wheelchair Mobility    Modified Rankin (Stroke Patients Only)       Balance                                    Cognition Arousal/Alertness: Awake/alert Behavior During Therapy: WFL for tasks assessed/performed Overall Cognitive Status: Within Functional Limits for tasks assessed                      Exercises      General Comments        Pertinent Vitals/Pain Pain is mild     Home Living Family/patient expects to be discharged to:: Private residence   Available Help at Discharge: Family Type of Home: House Home Access: Stairs to enter Entrance Stairs-Rails: None Home Layout: One level Home Equipment: Environmental consultant - 2 wheels      Prior Function Level of Independence: Independent          PT Goals (current goals can now be found in the care plan section) Acute Rehab PT Goals Patient Stated Goal: to walk today    Frequency  7X/week    PT Plan Current plan remains appropriate    Co-evaluation             End of Session Equipment Utilized During Treatment: Gait belt Activity Tolerance: Patient limited by fatigue Patient left: in chair;with call bell/phone within reach     Time: 1038-1100 PT Time Calculation (min): 22 min  Charges:  $Gait Training: 8-22 mins                    G Codes:      [name] 04/10/2014, 1:24 PM

## 2014-04-10 NOTE — Discharge Instructions (Addendum)
° °Dr. Frank Aluisio °Total Joint Specialist °[facility] °[address] °[place], [place] [address] °[phone] ° °TOTAL KNEE REPLACEMENT POSTOPERATIVE DIRECTIONS ° ° ° °Knee Rehabilitation, Guidelines Following Surgery  °Results after knee surgery are often greatly improved when you follow the exercise, range of motion and muscle strengthening exercises prescribed by your doctor. Safety measures are also important to protect the knee from further injury. Any time any of these exercises cause you to have increased pain or swelling in your knee joint, decrease the amount until you are comfortable again and slowly increase them. If you have problems or questions, call your caregiver or physical therapist for advice.  ° °HOME CARE INSTRUCTIONS  °Remove items at home which could result in a fall. This includes throw rugs or furniture in walking pathways.  °Continue medications as instructed at time of discharge. °You may have some home medications which will be placed on hold until you complete the course of blood thinner medication.  °You may start showering once you are discharged home but do not submerge the incision under water. Just pat the incision dry and apply a dry gauze dressing on daily. °Walk with walker as instructed.  °You may resume a sexual relationship in one month or when given the OK by  your doctor.  °· Use walker as long as suggested by your caregivers. °· Avoid periods of inactivity such as sitting longer than an hour when not asleep. This helps prevent blood clots.  °You may put full weight on your legs and walk as much as is comfortable.  °You may return to work once you are cleared by your doctor.  °Do not drive a car for 6 weeks or until released by you surgeon.  °· Do not drive while taking narcotics.  °Wear the elastic stockings for three weeks following surgery during the day but you may remove then at night. °Make sure you keep all of your appointments after your  operation with all of your doctors and caregivers. You should call the office at the above phone number and make an appointment for approximately two weeks after the date of your surgery. °Change the dressing daily and reapply a dry dressing each time. °Please pick up a stool softener and laxative for home use as long as you are requiring pain medications. °· Continue to use ice on the knee for pain and swelling from surgery. You may notice swelling that will progress down to the foot and ankle.  This is normal after surgery.  Elevate the leg when you are not up walking on it.   °It is important for you to complete the blood thinner medication as prescribed by your doctor. °· Continue to use the breathing machine which will help keep your temperature down.  It is common for your temperature to cycle up and down following surgery, especially at night when you are not up moving around and exerting yourself.  The breathing machine keeps your lungs expanded and your temperature down. ° °RANGE OF MOTION AND STRENGTHENING EXERCISES  °Rehabilitation of the knee is important following a knee injury or an operation. After just a few days of immobilization, the muscles of the thigh which control the knee become weakened and shrink (atrophy). Knee exercises are designed to build up the tone and strength of the thigh muscles and to improve knee motion. Often times heat used for twenty to thirty minutes before working out will loosen up your tissues and help with improving the   range of motion but do not use heat for the first two weeks following surgery. These exercises can be done on a training (exercise) mat, on the floor, on a table or on a bed. Use what ever works the best and is most comfortable for you Knee exercises include:  Leg Lifts - While your knee is still immobilized in a splint or cast, you can do straight leg raises. Lift the leg to 60 degrees, hold for 3 sec, and slowly lower the leg. Repeat 10-20 times 2-3  times daily. Perform this exercise against resistance later as your knee gets better.  Quad and Hamstring Sets - Tighten up the muscle on the front of the thigh (Quad) and hold for 5-10 sec. Repeat this 10-20 times hourly. Hamstring sets are done by pushing the foot backward against an object and holding for 5-10 sec. Repeat as with quad sets.  A rehabilitation program following serious knee injuries can speed recovery and prevent re-injury in the future due to weakened muscles. Contact your doctor or a physical therapist for more information on knee rehabilitation.   SKILLED REHAB INSTRUCTIONS: If the patient is transferred to a skilled rehab facility following release from the hospital, a list of the current medications will be sent to the facility for the patient to continue.  When discharged from the skilled rehab facility, please have the facility set up the patient's [place] prior to being released. Also, the skilled facility will be responsible for providing the patient with their medications at time of release from the facility to include their pain medication, the muscle relaxants, and their blood thinner medication. If the patient is still at the rehab facility at time of the two week follow up appointment, the skilled rehab facility will also need to assist the patient in arranging follow up appointment in our office and any transportation needs.  MAKE SURE YOU:  Understand these instructions.  Will watch your condition.  Will get help right away if you are not doing well or get worse.    Pick up stool softner and laxative for home. Do not submerge incision under water. May shower. Continue to use ice for pain and swelling from surgery.   Take Xarelto for two and a half more weeks, then discontinue Xarelto. Once the patient has completed the Xarelto, they may resume the 81 mg Aspirin.     Information on my medicine - XARELTO (Rivaroxaban)  This medication  education was reviewed with me or my healthcare representative as part of my discharge preparation.  The pharmacist that spoke with me during my hospital stay was:  Minda Ditto, [facility]  Why was Xarelto prescribed for you? Xarelto was prescribed for you to reduce the risk of blood clots forming after orthopedic surgery. The medical term for these abnormal blood clots is venous thromboembolism (VTE).  What do you need to know about xarelto ? Take your Xarelto ONCE DAILY at the same time every day. You may take it either with or without food.  If you have difficulty swallowing the tablet whole, you may crush it and mix in applesauce just prior to taking your dose.  Take Xarelto exactly as prescribed by your doctor and DO NOT stop taking Xarelto without talking to the doctor who prescribed the medication.  Stopping without other VTE prevention medication to take the place of Xarelto may increase your risk of developing a clot.  After discharge, you should have regular check-up appointments with your healthcare  provider that is prescribing your Xarelto.    What do you do if you miss a dose? If you miss a dose, take it as soon as you remember on the same day then continue your regularly scheduled once daily regimen the next day. Do not take two doses of Xarelto on the same day.   Important Safety Information A possible side effect of Xarelto is bleeding. You should call your healthcare provider right away if you experience any of the following:   Bleeding from an injury or your nose that does not stop.   Unusual colored urine (red or dark brown) or unusual colored stools (red or black).   Unusual bruising for unknown reasons.   A serious fall or if you hit your head (even if there is no bleeding).  Some medicines may interact with Xarelto and might increase your risk of bleeding while on Xarelto. To help avoid this, consult your healthcare provider or pharmacist prior to using any new  prescription or non-prescription medications, including herbals, vitamins, non-steroidal anti-inflammatory drugs (NSAIDs) and supplements.  This website has more information on Xarelto: [email or web address]

## 2014-04-10 NOTE — Care Management Note (Signed)
    Page 1 of 1   04/10/2014     12:09:03 PM   CARE MANAGEMENT NOTE 04/10/2014  Patient:  Beverly Moreno,Beverly Moreno   Account Number:  [id number]  Date Initiated:  04/10/2014  Documentation initiated by:  Sunday Spillers  Subjective/Objective Assessment:   73 yo female admitted s/p Right Total Knee Arthroplasty. PTA lived at home with spouse.     Action/Plan:   Home when stable   Anticipated DC Date:  04/12/2014   Anticipated DC Plan:  [place]  CM consult      Choice offered to / List presented to:             [place]   Status of service:  Completed, signed off Medicare Important Message given?  NA - LOS <3 / Initial given by admissions (If response is "NO", the following Medicare IM given date fields will be blank) Date Medicare IM given:   Date Additional Medicare IM given:    Discharge Disposition:  [place]  Per UR Regulation:  Reviewed for med. necessity/level of care/duration of stay  If discussed at [place] of Stay Meetings, dates discussed:    Comments:  04-10-14 Sunday Spillers RN CM Confirmed patient had [place] arranged with Arville Go PTA.

## 2014-04-10 NOTE — Progress Notes (Signed)
[facility]  Patient Status: inpatient  [facility] is providing the following services: rw and commode  If patient discharges after hours, please call [phone].   Linward Headland 04/10/2014, 1:39 PM

## 2014-04-10 NOTE — Progress Notes (Signed)
   Subjective: 1 Day Post-Op Procedure(s) (LRB): RIGHT TOTAL KNEE ARTHROPLASTY (Right) Patient reports pain as mild.   Patient seen in rounds with Dr. Wynelle Link.  Family in room at bedside. Patient is well, and has had no acute complaints or problems except for not much sleep. We will start therapy today.  Plan is to go Home after hospital stay.  Objective: Vital signs in last 24 hours: Temp:  [97.4 F (36.3 C)-98.4 F (36.9 C)] 97.9 F (36.6 C) (04/14 0631) Pulse Rate:  [52-65] 60 (04/14 0631) Resp:  [10-18] 18 (04/14 0631) BP: (102-143)/(60-80) 102/65 mmHg (04/14 0631) SpO2:  [93 %-100 %] 94 % (04/14 0631) Weight:  [83.73 kg (184 lb 9.5 oz)] 83.73 kg (184 lb 9.5 oz) (04/13 2300)  Intake/Output from previous day:  Intake/Output Summary (Last 24 hours) at 04/10/14 0816 Last data filed at 04/10/14 9924  Gross per 24 hour  Intake   2180 ml  Output   3122 ml  Net   -942 ml    Intake/Output this shift:    Labs:  Recent Labs  04/10/14 0420  HGB 10.2*    Recent Labs  04/10/14 0420  WBC 10.7*  RBC 3.55*  HCT 29.3*  PLT 206    Recent Labs  04/10/14 0420  NA 139  K 3.7  CL 102  CO2 24  BUN 12  CREATININE 0.74  GLUCOSE 186*  CALCIUM 8.9   No results found for this basename: LABPT, INR,  in the last 72 hours  EXAM General - Patient is Alert, Appropriate and Oriented Extremity - Neurovascular intact Sensation intact distally Dressing - dressing C/D/I Motor Function - intact, moving foot and toes well on exam.  Hemovac pulled without difficulty.  Past Medical History  Diagnosis Date  . Hypertension   . Arthritis     Assessment/Plan: 1 Day Post-Op Procedure(s) (LRB): RIGHT TOTAL KNEE ARTHROPLASTY (Right) Principal Problem:   OA (osteoarthritis) of knee Active Problems:   Postoperative anemia due to acute blood loss   Unspecified essential hypertension  Estimated body mass index is 32.18 kg/(m^2) as calculated from the following:   Height as of  this encounter: 5' 3.5" (1.613 m).   Weight as of this encounter: 83.73 kg (184 lb 9.5 oz). Advance diet Up with therapy Plan for discharge tomorrow Discharge home with home health  DVT Prophylaxis - Xarelto Weight-Bearing as tolerated to right leg D/C O2 and Pulse OX and try on Room Air  Charika Mikelson 04/10/2014, 8:16 AM

## 2014-04-10 NOTE — Evaluation (Signed)
Occupational Therapy Evaluation Patient Details Name: Beverly Moreno MRN: [id number] DOB: Mar 28, 1941 Today's Date: 04/10/2014    History of Present Illness Pt is s/p R TKA with history of osteoporosis, HTN and lumbar pain.    Clinical Impression   Pt is overall at min assist level for functional transfers and mod assist LB ADL. Will benefit from continued OT services to maximize ADL independence for d/c home with family.    Follow Up Recommendations  No OT follow up    Equipment Recommendations  3 in 1 bedside comode    Recommendations for Other Services       Precautions / Restrictions Precautions Precautions: Knee;Fall Required Braces or Orthoses: Knee Immobilizer - Right Restrictions Weight Bearing Restrictions: No RLE Weight Bearing: Weight bearing as tolerated      Mobility Bed Mobility                  Transfers Overall transfer level: Needs assistance Equipment used: Rolling walker (2 wheeled) Transfers: Sit to/from Stand Sit to Stand: Min assist         General transfer comment: verbal cues for hand placement and LE management.    Balance                                            ADL Overall ADL's : Needs assistance/impaired Eating/Feeding: Independent;Sitting   Grooming: Wash/dry hands;Minimal assistance;Standing   Upper Body Bathing: Set up;Sitting   Lower Body Bathing: Moderate assistance;Sit to/from stand   Upper Body Dressing : Set up;Sitting   Lower Body Dressing: Moderate assistance;Sit to/from stand   Toilet Transfer: Minimal assistance;BSC;RW;Ambulation   Toileting- Clothing Manipulation and Hygiene: Minimal assistance;Sit to/from stand       Functional mobility during ADLs: Rolling walker General ADL Comments: Pt needs reinforcement with hand placement and LE management during functional transfers. Her son is present during session. Educated on sequence of LB ADL and discussed adjustment of 3in1 for  appropriate height and use as a shower seat.     Vision                     Perception     Praxis      Pertinent Vitals/Pain 5/10 R knee; reposition, ice     Hand Dominance     Extremity/Trunk Assessment Upper Extremity Assessment Upper Extremity Assessment: Overall WFL for tasks assessed           Communication Communication Communication: No difficulties   Cognition Arousal/Alertness: Awake/alert Behavior During Therapy: WFL for tasks assessed/performed Overall Cognitive Status: Within Functional Limits for tasks assessed                     General Comments       Exercises       Shoulder Instructions      Home Living Family/patient expects to be discharged to:: Private residence   Available Help at Discharge: Family Type of Home: House Home Access: Stairs to enter Technical brewer of Steps: 2 Entrance Stairs-Rails: None Home Layout: One level     Bathroom Shower/Tub: Occupational psychologist: Standard     Home Equipment: Environmental consultant - 2 wheels          Prior Functioning/Environment Level of Independence: Independent             OT Diagnosis: Generalized weakness  OT Problem List: Decreased strength;Decreased knowledge of use of DME or AE   OT Treatment/Interventions: Self-care/ADL training;DME and/or AE instruction;Therapeutic activities;Patient/family education    OT Goals(Current goals can be found in the care plan section) Acute Rehab OT Goals Patient Stated Goal: to walk today OT Goal Formulation: With patient/family Time For Goal Achievement: 04/17/14 Potential to Achieve Goals: Good  OT Frequency: Min 2X/week   Barriers to D/C:            Co-evaluation              End of Session Equipment Utilized During Treatment: Rolling walker;Gait belt;Right knee immobilizer  Activity Tolerance: Patient tolerated treatment well Patient left: in chair;with call bell/phone within reach;with  family/visitor present   Time: 1216-1242 OT Time Calculation (min): 26 min Charges:  OT General Charges $OT Visit: 1 Procedure OT Evaluation $Initial OT Evaluation Tier I: 1 Procedure OT Treatments $Therapeutic Activity: 8-22 mins G-Codes:    Jules Schick [phone] 04/10/2014, 12:58 PM

## 2014-04-11 LAB — CBC
HCT: 29.5 % — ABNORMAL LOW (ref 36.0–46.0)
Hemoglobin: 10.2 g/dL — ABNORMAL LOW (ref 12.0–15.0)
MCH: 28.9 pg (ref 26.0–34.0)
MCHC: 34.6 g/dL (ref 30.0–36.0)
MCV: 83.6 fL (ref 78.0–100.0)
Platelets: 206 10*3/uL (ref 150–400)
RBC: 3.53 MIL/uL — ABNORMAL LOW (ref 3.87–5.11)
RDW: 13.4 % (ref 11.5–15.5)
WBC: 9.7 10*3/uL (ref 4.0–10.5)

## 2014-04-11 LAB — BASIC METABOLIC PANEL
BUN: 16 mg/dL (ref 6–23)
CO2: 28 mEq/L (ref 19–32)
Calcium: 9.4 mg/dL (ref 8.4–10.5)
Chloride: 102 mEq/L (ref 96–112)
Creatinine, Ser: 0.93 mg/dL (ref 0.50–1.10)
GFR calc Af Amer: 69 mL/min — ABNORMAL LOW (ref >90)
GFR calc non Af Amer: 60 mL/min — ABNORMAL LOW (ref >90)
Glucose, Bld: 116 mg/dL — ABNORMAL HIGH (ref 70–99)
Potassium: 4.3 mEq/L (ref 3.7–5.3)
Sodium: 141 mEq/L (ref 137–147)

## 2014-04-11 MED ORDER — RIVAROXABAN 10 MG PO TABS: 10.0000 mg | ORAL_TABLET | Freq: Every day | ORAL | Status: DC

## 2014-04-11 MED ORDER — OXYCODONE HCL 5 MG PO TABS: 5.0000 mg | ORAL_TABLET | ORAL | Status: DC | PRN

## 2014-04-11 MED ORDER — METHOCARBAMOL 500 MG PO TABS: 500.0000 mg | ORAL_TABLET | Freq: Four times a day (QID) | ORAL | Status: DC | PRN

## 2014-04-11 MED ORDER — TRAMADOL HCL 50 MG PO TABS: 50.0000 mg | ORAL_TABLET | Freq: Four times a day (QID) | ORAL | Status: DC | PRN

## 2014-04-11 NOTE — Progress Notes (Signed)
Occupational Therapy Treatment Patient Details Name: Beverly Moreno MRN: [id number] DOB: 02-09-1941 Today's Date: 04/11/2014    History of present illness Pt is s/p R TKA with history of osteoporosis, HTN and lumbar pain.    OT comments  Pt doing well and supposed to d/c today. Educated on shower transfer and DME use. Reviewed KI and how to don/doff.   Follow Up Recommendations  No OT follow up    Equipment Recommendations  3 in 1 bedside comode    Recommendations for Other Services      Precautions / Restrictions Precautions Precautions: Knee;Fall Required Braces or Orthoses: Knee Immobilizer - Right Restrictions RLE Weight Bearing: Weight bearing as tolerated       Mobility Bed Mobility Overal bed mobility: Needs Assistance Bed Mobility: Supine to Sit     Supine to sit: Supervision        Transfers Overall transfer level: Needs assistance Equipment used: Rolling walker (2 wheeled) Transfers: Sit to/from Stand Sit to Stand: Supervision         General transfer comment: supervision for safety    Balance                                   ADL                                   Tub/ Shower Transfer: Walk-in shower;Min guard;Rolling walker   Functional mobility during ADLs: Min guard;Rolling walker General ADL Comments: Husband present and pt and spouse report he helped her to 3in1 this am and feel comfortable with toilet transfer sequence. She declined need to practice again. Did practice shower transfer and pt did well. Discussed at length placement of 3in1 for shower seat and how husband is to assist with steadying walker. Also discussed KI use and to wear in /out of shower if still not SLR versus sponge bathe until KI not needed. Pt and spouse verbalize understanding of all education.       Vision                     Perception     Praxis      Cognition   Behavior During Therapy: [facility] for tasks  assessed/performed Overall Cognitive Status: Within Functional Limits for tasks assessed                       Extremity/Trunk Assessment               Exercises     Shoulder Instructions       General Comments      Pertinent Vitals/ Pain       4/10; reposition, PT to work with pt when OT finished. Ice packs ready.  Home Living                                          Prior Functioning/Environment              Frequency Min 2X/week     Progress Toward Goals  OT Goals(current goals can now be found in the care plan section)  Progress towards OT goals: Progressing toward goals     Plan Discharge plan remains appropriate  Co-evaluation                 End of Session Equipment Utilized During Treatment: Rolling walker;Right knee immobilizer CPM Right Knee CPM Right Knee: Off   Activity Tolerance Patient tolerated treatment well   Patient Left in chair;with call bell/phone within reach;with family/visitor present   Nurse Communication          Time: 1324-4010 OT Time Calculation (min): 22 min  Charges: OT General Charges $OT Visit: 1 Procedure OT Treatments $Therapeutic Activity: 8-22 mins  [place] [phone] 04/11/2014, 9:07 AM

## 2014-04-11 NOTE — Discharge Summary (Signed)
Physician Discharge Summary   Patient ID: Beverly Moreno MRN: [id number] DOB/AGE: 1941/10/10 73 y.o.  Admit date: 04/09/2014 Discharge date: 04/11/2014  Primary Diagnosis:  Osteoarthritis Right knee(s)  Admission Diagnoses:  Past Medical History  Diagnosis Date  . Hypertension   . Arthritis    Discharge Diagnoses:   Principal Problem:   OA (osteoarthritis) of knee Active Problems:   Postoperative anemia due to acute blood loss   Unspecified essential hypertension  Estimated body mass index is 32.18 kg/(m^2) as calculated from the following:   Height as of this encounter: 5' 3.5" (1.613 m).   Weight as of this encounter: 83.73 kg (184 lb 9.5 oz).  Procedure:  Procedure(s) (LRB): RIGHT TOTAL KNEE ARTHROPLASTY (Right)   Consults: None  HPI: Beverly Moreno is a 73 y.o. year old female with end stage OA of her right knee with progressively worsening pain and dysfunction. She has constant pain, with activity and at rest and significant functional deficits with difficulties even with ADLs. She has had extensive non-op management including analgesics, injections of cortisone and viscosupplements, and home exercise program, but remains in significant pain with significant dysfunction.Radiographs show bone on bone arthritis medial and patellofemoral. She presents now for right Total Knee Arthroplasty.  Laboratory Data: Admission on 04/09/2014, Discharged on 04/11/2014  Component Date Value Ref Range Status  . MRSA, PCR 04/02/2014 INVALID RESULTS, SPECIMEN SENT FOR CULTURE* NEGATIVE Final  . Staphylococcus aureus 04/02/2014 INVALID RESULTS, SPECIMEN SENT FOR CULTURE* NEGATIVE Final   Comment:                                 The Xpert SA Assay (FDA                          approved for NASAL specimens                          in patients over 62 years of age),                          is one component of                          a comprehensive surveillance   program.  Test performance has                          been validated by [employer] for patients greater                          than or equal to 37 year old.                          It is not intended                          to diagnose infection nor to                          guide or monitor treatment.  Marland Kitchen Specimen  Description 04/02/2014 NOSE   Final  . Special Requests 04/02/2014 NONE   Final  . Culture 04/02/2014    Final                   Value:NO STAPHYLOCOCCUS AUREUS ISOLATED                         Note: NOMRSA                         Performed at [facility]  . Report Status 04/02/2014 04/05/2014 FINAL   Final  . ABO/RH(D) 04/09/2014 O POS   Final  . Antibody Screen 04/09/2014 NEG   Final  . Sample Expiration 04/09/2014 04/12/2014   Final  . ABO/RH(D) 04/09/2014 O POS   Final  . WBC 04/10/2014 10.7* 4.0 - 10.5 K/uL Final  . RBC 04/10/2014 3.55* 3.87 - 5.11 MIL/uL Final  . Hemoglobin 04/10/2014 10.2* 12.0 - 15.0 g/dL Final  . HCT 04/10/2014 29.3* 36.0 - 46.0 % Final  . MCV 04/10/2014 82.5  78.0 - 100.0 fL Final  . MCH 04/10/2014 28.7  26.0 - 34.0 pg Final  . MCHC 04/10/2014 34.8  30.0 - 36.0 g/dL Final  . RDW 04/10/2014 13.1  11.5 - 15.5 % Final  . Platelets 04/10/2014 206  150 - 400 K/uL Final  . Sodium 04/10/2014 139  137 - 147 mEq/L Final  . Potassium 04/10/2014 3.7  3.7 - 5.3 mEq/L Final  . Chloride 04/10/2014 102  96 - 112 mEq/L Final  . CO2 04/10/2014 24  19 - 32 mEq/L Final  . Glucose, Bld 04/10/2014 186* 70 - 99 mg/dL Final  . BUN 04/10/2014 12  6 - 23 mg/dL Final  . Creatinine, Ser 04/10/2014 0.74  0.50 - 1.10 mg/dL Final  . Calcium 04/10/2014 8.9  8.4 - 10.5 mg/dL Final  . GFR calc non Af Amer 04/10/2014 82* >90 mL/min Final  . GFR calc Af Amer 04/10/2014 >90  >90 mL/min Final   Comment: (NOTE)                          The eGFR has been calculated using the CKD EPI equation.                          This calculation has  not been validated in all clinical situations.                          eGFR's persistently <90 mL/min signify possible Chronic Kidney                          Disease.  . WBC 04/11/2014 9.7  4.0 - 10.5 K/uL Final  . RBC 04/11/2014 3.53* 3.87 - 5.11 MIL/uL Final  . Hemoglobin 04/11/2014 10.2* 12.0 - 15.0 g/dL Final  . HCT 04/11/2014 29.5* 36.0 - 46.0 % Final  . MCV 04/11/2014 83.6  78.0 - 100.0 fL Final  . MCH 04/11/2014 28.9  26.0 - 34.0 pg Final  . MCHC 04/11/2014 34.6  30.0 - 36.0 g/dL Final  . RDW 04/11/2014 13.4  11.5 - 15.5 % Final  . Platelets 04/11/2014 206  150 - 400 K/uL Final  . Sodium 04/11/2014 141  137 - 147 mEq/L Final  . Potassium 04/11/2014 4.3  3.7 - 5.3 mEq/L Final  . Chloride 04/11/2014 102  96 - 112 mEq/L Final  . CO2 04/11/2014 28  19 - 32 mEq/L Final  . Glucose, Bld 04/11/2014 116* 70 - 99 mg/dL Final  . BUN 04/11/2014 16  6 - 23 mg/dL Final  . Creatinine, Ser 04/11/2014 0.93  0.50 - 1.10 mg/dL Final  . Calcium 04/11/2014 9.4  8.4 - 10.5 mg/dL Final  . GFR calc non Af Amer 04/11/2014 60* >90 mL/min Final  . GFR calc Af Amer 04/11/2014 69* >90 mL/min Final   Comment: (NOTE)                          The eGFR has been calculated using the CKD EPI equation.                          This calculation has not been validated in all clinical situations.                          eGFR's persistently <90 mL/min signify possible Chronic Kidney                          Disease.  Hospital Outpatient Visit on 03/28/2014  Component Date Value Ref Range Status  . MRSA, PCR 03/28/2014 INVALID RESULTS, SPECIMEN SENT FOR CULTURE* NEGATIVE Final   INFORMED CHAPMAN,P. RN '@1310'  ON 03/28/14 BY MCCOY,N.  . Staphylococcus aureus 03/28/2014 INVALID RESULTS, SPECIMEN SENT FOR CULTURE* NEGATIVE Final   Comment: INFORMED CHAPMAN,P. RN '@1310'  ON 03/28/14 BY MCCOY,N.                                                          The Xpert SA Assay (FDA                          approved for NASAL  specimens                          in patients over 33 years of age),                          is one component of                          a comprehensive surveillance                          program.  Test performance has                          been validated by [employer] for patients greater                          than or equal to 76 year old.  It is not intended                          to diagnose infection nor to                          guide or monitor treatment.  Marland Kitchen aPTT 03/28/2014 33  24 - 37 seconds Final  . WBC 03/28/2014 4.2  4.0 - 10.5 K/uL Final  . RBC 03/28/2014 4.58  3.87 - 5.11 MIL/uL Final  . Hemoglobin 03/28/2014 13.1  12.0 - 15.0 g/dL Final  . HCT 03/28/2014 38.0  36.0 - 46.0 % Final  . MCV 03/28/2014 83.0  78.0 - 100.0 fL Final  . MCH 03/28/2014 28.6  26.0 - 34.0 pg Final  . MCHC 03/28/2014 34.5  30.0 - 36.0 g/dL Final  . RDW 03/28/2014 13.2  11.5 - 15.5 % Final  . Platelets 03/28/2014 231  150 - 400 K/uL Final  . Sodium 03/28/2014 140  137 - 147 mEq/L Final  . Potassium 03/28/2014 4.1  3.7 - 5.3 mEq/L Final  . Chloride 03/28/2014 102  96 - 112 mEq/L Final  . CO2 03/28/2014 25  19 - 32 mEq/L Final  . Glucose, Bld 03/28/2014 97  70 - 99 mg/dL Final  . BUN 03/28/2014 21  6 - 23 mg/dL Final  . Creatinine, Ser 03/28/2014 0.84  0.50 - 1.10 mg/dL Final  . Calcium 03/28/2014 9.7  8.4 - 10.5 mg/dL Final  . Total Protein 03/28/2014 6.8  6.0 - 8.3 g/dL Final  . Albumin 03/28/2014 3.9  3.5 - 5.2 g/dL Final  . AST 03/28/2014 20  0 - 37 U/L Final  . ALT 03/28/2014 14  0 - 35 U/L Final  . Alkaline Phosphatase 03/28/2014 62  39 - 117 U/L Final  . Total Bilirubin 03/28/2014 0.4  0.3 - 1.2 mg/dL Final  . GFR calc non Af Amer 03/28/2014 67* >90 mL/min Final  . GFR calc Af Amer 03/28/2014 78* >90 mL/min Final   Comment: (NOTE)                          The eGFR has been calculated using the CKD EPI equation.                           This calculation has not been validated in all clinical situations.                          eGFR's persistently <90 mL/min signify possible Chronic Kidney                          Disease.  Marland Kitchen Prothrombin Time 03/28/2014 13.4  11.6 - 15.2 seconds Final  . INR 03/28/2014 1.04  0.00 - 1.49 Final  . Color, Urine 03/28/2014 YELLOW  YELLOW Final  . APPearance 03/28/2014 CLEAR  CLEAR Final  . Specific Gravity, Urine 03/28/2014 1.020  1.005 - 1.030 Final  . pH 03/28/2014 5.5  5.0 - 8.0 Final  . Glucose, UA 03/28/2014 NEGATIVE  NEGATIVE mg/dL Final  . Hgb urine dipstick 03/28/2014 NEGATIVE  NEGATIVE Final  . Bilirubin Urine 03/28/2014 NEGATIVE  NEGATIVE Final  . Ketones, ur 03/28/2014 NEGATIVE  NEGATIVE mg/dL Final  . Protein, ur 03/28/2014 NEGATIVE  NEGATIVE mg/dL Final  .  Urobilinogen, UA 03/28/2014 0.2  0.0 - 1.0 mg/dL Final  . Nitrite 03/28/2014 NEGATIVE  NEGATIVE Final  . Leukocytes, UA 03/28/2014 TRACE* NEGATIVE Final  . WBC, UA 03/28/2014 0-2  <3 WBC/hpf Final  . RBC / HPF 03/28/2014 0-2  <3 RBC/hpf Final     X-Rays:Dg Chest 2 View  03/28/2014   CLINICAL DATA:  Preoperative radiograph. Right total knee arthroplasty.  EXAM: CHEST  2 VIEW  COMPARISON:  None.  FINDINGS: The heart size and mediastinal contours are within normal limits. Both lungs are clear. The visualized skeletal structures are unremarkable.  IMPRESSION: No active cardiopulmonary disease.   Electronically Signed   By: Dereck Ligas M.D.   On: 03/28/2014 12:57    EKG: Orders placed during the hospital encounter of 03/28/14  . EKG 12-LEAD  . EKG 12-LEAD     Hospital Course: Beverly Moreno is a 73 y.o. who was admitted to [facility]. They were brought to the operating room on 04/09/2014 and underwent Procedure(s): RIGHT TOTAL KNEE ARTHROPLASTY.  Patient tolerated the procedure well and was later transferred to the recovery room and then to the orthopaedic floor for postoperative care.  They were given PO  and IV analgesics for pain control following their surgery.  They were given 24 hours of postoperative antibiotics of  Anti-infectives   Start     Dose/Rate Route Frequency Ordered Stop   04/09/14 1600  ceFAZolin (ANCEF) IVPB 2 g/50 mL premix     2 g 100 mL/hr over 30 Minutes Intravenous Every 6 hours 04/09/14 1233 04/09/14 2242   04/09/14 0636  ceFAZolin (ANCEF) IVPB 2 g/50 mL premix     2 g 100 mL/hr over 30 Minutes Intravenous On call to O.R. 04/09/14 7209 04/09/14 4709     and started on DVT prophylaxis in the form of Xarelto.   PT and OT were ordered for total joint protocol.  Discharge planning consulted to help with postop disposition and equipment needs.  Patient had a decent night on the evening of surgery except not much sleep.  Started getting up OOB with therapy on day one. Hemovac drain was pulled without difficulty.  Continued to work with therapy into day two.  Dressing was changed on day two and the incision was healing well.  Patient was seen in rounds and was ready to go home.  Discharge home with home health  Diet - Cardiac diet  Follow up - in 2 weeks  Activity - WBAT  Disposition - Home  Condition Upon Discharge - Good  D/C Meds - See DC Summary  DVT Prophylaxis - Xarelto       Discharge Orders   Future Orders Complete By Expires   Call MD / Call 911  As directed    Change dressing  As directed    Constipation Prevention  As directed    Diet - low sodium heart healthy  As directed    Discharge instructions  As directed    Do not put a pillow under the knee. Place it under the heel.  As directed    Do not sit on low chairs, stoools or toilet seats, as it may be difficult to get up from low surfaces  As directed    Driving restrictions  As directed    Increase activity slowly as tolerated  As directed    Lifting restrictions  As directed    Patient may shower  As directed    TED hose  As directed  Weight bearing as tolerated  As directed    Questions:      Laterality:     Extremity:         Medication List    STOP taking these medications       aspirin EC 81 MG tablet     Biotin 300 MCG Tabs     CALCIUM 600+D3 600-800 MG-UNIT Tabs  Generic drug:  Calcium Carb-Cholecalciferol     cholecalciferol 1000 UNITS tablet  Commonly known as:  VITAMIN D     ibuprofen 200 MG tablet  Commonly known as:  ADVIL,MOTRIN     multivitamin with minerals Tabs tablet     multivitamin-lutein Caps capsule      TAKE these medications       atorvastatin 20 MG tablet  Commonly known as:  LIPITOR  Take 20 mg by mouth daily.     methocarbamol 500 MG tablet  Commonly known as:  ROBAXIN  Take 1 tablet (500 mg total) by mouth every 6 (six) hours as needed for muscle spasms.     olmesartan-hydrochlorothiazide 20-12.5 MG per tablet  Commonly known as:  BENICAR HCT  Take 1 tablet by mouth every morning.     oxyCODONE 5 MG immediate release tablet  Commonly known as:  Oxy IR/ROXICODONE  Take 1-2 tablets (5-10 mg total) by mouth every 3 (three) hours as needed for moderate pain, severe pain or breakthrough pain.     rivaroxaban 10 MG Tabs tablet  Commonly known as:  XARELTO  - Take 1 tablet (10 mg total) by mouth daily with breakfast. Take Xarelto for two and a half more weeks, then discontinue Xarelto.  - Once the patient has completed the Xarelto, they may resume the 81 mg Aspirin.     traMADol 50 MG tablet  Commonly known as:  ULTRAM  Take 1-2 tablets (50-100 mg total) by mouth every 6 (six) hours as needed (mild to moderate pain).       Follow-up Information   Follow up with Gearlean Alf, MD. Schedule an appointment as soon as possible for a visit in 2 weeks.   Specialty:  Orthopedic Surgery   Contact information:   [address] [place] [address] [phone]       Signed: Mickel Crow 05/07/2014, 7:43 AM

## 2014-04-11 NOTE — Progress Notes (Signed)
Physical Therapy Treatment Patient Details Name: Beverly Moreno MRN: [id number] DOB: 09-09-1941 Today's Date: 04/11/2014    History of Present Illness Pt is s/p R TKA with history of osteoporosis, HTN and lumbar pain.     PT Comments    Pt progressing well, ready for DC  Follow Up Recommendations  Home health PT     Equipment Recommendations  None recommended by PT    Recommendations for Other Services       Precautions / Restrictions Precautions Precautions: Knee;Fall Required Braces or Orthoses: Knee Immobilizer - Right    Mobility  Bed Mobility   Bed Mobility: Supine to Sit     Supine to sit: Supervision        Transfers Overall transfer level: Needs assistance Equipment used: Rolling walker (2 wheeled) Transfers: Sit to/from Stand Sit to Stand: Supervision         General transfer comment: supervision for safety  Ambulation/Gait Ambulation/Gait assistance: Supervision Ambulation Distance (Feet): 80 Feet Assistive device: Rolling walker (2 wheeled) Gait Pattern/deviations: Step-through pattern     General Gait Details: cues for sequence, became nauseated after return to room   Stairs Stairs: Yes Stairs assistance: Min assist Stair Management: No rails;Step to pattern;Backwards;With walker Number of Stairs: 2 General stair comments: spouse present to assist.  Wheelchair Mobility    Modified Rankin (Stroke Patients Only)       Balance                                    Cognition Arousal/Alertness: Awake/alert                          Exercises Total Joint Exercises Ankle Circles/Pumps: AROM;Both;10 reps;Supine Quad Sets: AROM;Both;Supine;10 reps Towel Squeeze: AROM;Both;10 reps Short Arc Quad: AROM;Right;10 reps;Supine Heel Slides: AROM;Right;10 reps;Supine Hip ABduction/ADduction: AROM;Right;10 reps;Supine Straight Leg Raises: AAROM;Right;10 reps;Supine Goniometric ROM: 10-50    General Comments         Pertinent Vitals/Pain 4     Home Living                      Prior Function            PT Goals (current goals can now be found in the care plan section) Progress towards PT goals: Progressing toward goals    Frequency  7X/week    PT Plan Current plan remains appropriate    Co-evaluation             End of Session   Activity Tolerance: Patient tolerated treatment well Patient left: in bed;with call bell/phone within reach;with family/visitor present     Time: 4944-9675 PT Time Calculation (min): 41 min  Charges:  $Gait Training: 8-22 mins $Therapeutic Exercise: 8-22 mins $Self Care/Home Management: 8-22                    G Codes:      Beverly Moreno 04/11/2014, 1:53 PM

## 2014-04-11 NOTE — Progress Notes (Signed)
   Subjective: 2 Days Post-Op Procedure(s) (LRB): RIGHT TOTAL KNEE ARTHROPLASTY (Right) Patient reports pain as mild.   Patient seen in rounds with Dr. Wynelle Link. Family in room. Patient is well, and has had no acute complaints or problems Patient is ready to go home today after therapy.  Objective: Vital signs in last 24 hours: Temp:  [97.7 F (36.5 C)-99.2 F (37.3 C)] 98.3 F (36.8 C) (04/15 0618) Pulse Rate:  [56-76] 62 (04/15 0618) Resp:  [16-18] 16 (04/15 0618) BP: (109-149)/(65-77) 149/73 mmHg (04/15 0618) SpO2:  [93 %-94 %] 94 % (04/15 0618)  Intake/Output from previous day:  Intake/Output Summary (Last 24 hours) at 04/11/14 0721 Last data filed at 04/11/14 0600  Gross per 24 hour  Intake    840 ml  Output    300 ml  Net    540 ml    Intake/Output this shift:    Labs:  Recent Labs  04/10/14 0420 04/11/14 0420  HGB 10.2* 10.2*    Recent Labs  04/10/14 0420 04/11/14 0420  WBC 10.7* 9.7  RBC 3.55* 3.53*  HCT 29.3* 29.5*  PLT 206 206    Recent Labs  04/10/14 0420 04/11/14 0420  NA 139 141  K 3.7 4.3  CL 102 102  CO2 24 28  BUN 12 16  CREATININE 0.74 0.93  GLUCOSE 186* 116*  CALCIUM 8.9 9.4   No results found for this basename: LABPT, INR,  in the last 72 hours  EXAM: General - Patient is Alert, Appropriate and Oriented Extremity - Neurovascular intact Sensation intact distally Dorsiflexion/Plantar flexion intact Incision - clean, dry, no drainage, healing Motor Function - intact, moving foot and toes well on exam.   Assessment/Plan: 2 Days Post-Op Procedure(s) (LRB): RIGHT TOTAL KNEE ARTHROPLASTY (Right) Procedure(s) (LRB): RIGHT TOTAL KNEE ARTHROPLASTY (Right) Past Medical History  Diagnosis Date  . Hypertension   . Arthritis    Principal Problem:   OA (osteoarthritis) of knee Active Problems:   Postoperative anemia due to acute blood loss   Unspecified essential hypertension  Estimated body mass index is 32.18 kg/(m^2) as  calculated from the following:   Height as of this encounter: 5' 3.5" (1.613 m).   Weight as of this encounter: 83.73 kg (184 lb 9.5 oz). Up with therapy Discharge home with home health Diet - Cardiac diet Follow up - in 2 weeks Activity - WBAT Disposition - Home Condition Upon Discharge - Good D/C Meds - See DC Summary DVT Prophylaxis - Xarelto  Oni Dietzman 04/11/2014, 7:21 AM

## 2014-04-12 DIAGNOSIS — Z7901 Long term (current) use of anticoagulants: Secondary | ICD-10-CM | POA: Diagnosis not present

## 2014-04-12 DIAGNOSIS — I10 Essential (primary) hypertension: Secondary | ICD-10-CM | POA: Diagnosis not present

## 2014-04-12 DIAGNOSIS — Z4801 Encounter for change or removal of surgical wound dressing: Secondary | ICD-10-CM | POA: Diagnosis not present

## 2014-04-12 DIAGNOSIS — Z96659 Presence of unspecified artificial knee joint: Secondary | ICD-10-CM | POA: Diagnosis not present

## 2014-04-12 DIAGNOSIS — Z471 Aftercare following joint replacement surgery: Secondary | ICD-10-CM | POA: Diagnosis not present

## 2014-04-13 DIAGNOSIS — Z471 Aftercare following joint replacement surgery: Secondary | ICD-10-CM | POA: Diagnosis not present

## 2014-04-13 DIAGNOSIS — Z4801 Encounter for change or removal of surgical wound dressing: Secondary | ICD-10-CM | POA: Diagnosis not present

## 2014-04-13 DIAGNOSIS — I10 Essential (primary) hypertension: Secondary | ICD-10-CM | POA: Diagnosis not present

## 2014-04-13 DIAGNOSIS — Z96659 Presence of unspecified artificial knee joint: Secondary | ICD-10-CM | POA: Diagnosis not present

## 2014-04-13 DIAGNOSIS — Z7901 Long term (current) use of anticoagulants: Secondary | ICD-10-CM | POA: Diagnosis not present

## 2014-04-16 DIAGNOSIS — Z471 Aftercare following joint replacement surgery: Secondary | ICD-10-CM | POA: Diagnosis not present

## 2014-04-16 DIAGNOSIS — I10 Essential (primary) hypertension: Secondary | ICD-10-CM | POA: Diagnosis not present

## 2014-04-16 DIAGNOSIS — Z96659 Presence of unspecified artificial knee joint: Secondary | ICD-10-CM | POA: Diagnosis not present

## 2014-04-16 DIAGNOSIS — Z7901 Long term (current) use of anticoagulants: Secondary | ICD-10-CM | POA: Diagnosis not present

## 2014-04-16 DIAGNOSIS — Z4801 Encounter for change or removal of surgical wound dressing: Secondary | ICD-10-CM | POA: Diagnosis not present

## 2014-04-17 DIAGNOSIS — Z96659 Presence of unspecified artificial knee joint: Secondary | ICD-10-CM | POA: Diagnosis not present

## 2014-04-17 DIAGNOSIS — Z7901 Long term (current) use of anticoagulants: Secondary | ICD-10-CM | POA: Diagnosis not present

## 2014-04-17 DIAGNOSIS — I10 Essential (primary) hypertension: Secondary | ICD-10-CM | POA: Diagnosis not present

## 2014-04-17 DIAGNOSIS — Z4801 Encounter for change or removal of surgical wound dressing: Secondary | ICD-10-CM | POA: Diagnosis not present

## 2014-04-17 DIAGNOSIS — Z471 Aftercare following joint replacement surgery: Secondary | ICD-10-CM | POA: Diagnosis not present

## 2014-04-18 DIAGNOSIS — Z96659 Presence of unspecified artificial knee joint: Secondary | ICD-10-CM | POA: Diagnosis not present

## 2014-04-18 DIAGNOSIS — Z471 Aftercare following joint replacement surgery: Secondary | ICD-10-CM | POA: Diagnosis not present

## 2014-04-18 DIAGNOSIS — I10 Essential (primary) hypertension: Secondary | ICD-10-CM | POA: Diagnosis not present

## 2014-04-18 DIAGNOSIS — Z7901 Long term (current) use of anticoagulants: Secondary | ICD-10-CM | POA: Diagnosis not present

## 2014-04-18 DIAGNOSIS — Z4801 Encounter for change or removal of surgical wound dressing: Secondary | ICD-10-CM | POA: Diagnosis not present

## 2014-04-19 DIAGNOSIS — R609 Edema, unspecified: Secondary | ICD-10-CM | POA: Diagnosis not present

## 2014-04-19 DIAGNOSIS — I10 Essential (primary) hypertension: Secondary | ICD-10-CM | POA: Diagnosis not present

## 2014-04-20 DIAGNOSIS — Z4801 Encounter for change or removal of surgical wound dressing: Secondary | ICD-10-CM | POA: Diagnosis not present

## 2014-04-20 DIAGNOSIS — Z471 Aftercare following joint replacement surgery: Secondary | ICD-10-CM | POA: Diagnosis not present

## 2014-04-20 DIAGNOSIS — I10 Essential (primary) hypertension: Secondary | ICD-10-CM | POA: Diagnosis not present

## 2014-04-20 DIAGNOSIS — Z96659 Presence of unspecified artificial knee joint: Secondary | ICD-10-CM | POA: Diagnosis not present

## 2014-04-20 DIAGNOSIS — Z7901 Long term (current) use of anticoagulants: Secondary | ICD-10-CM | POA: Diagnosis not present

## 2014-04-21 DIAGNOSIS — Z4801 Encounter for change or removal of surgical wound dressing: Secondary | ICD-10-CM | POA: Diagnosis not present

## 2014-04-21 DIAGNOSIS — Z7901 Long term (current) use of anticoagulants: Secondary | ICD-10-CM | POA: Diagnosis not present

## 2014-04-21 DIAGNOSIS — I10 Essential (primary) hypertension: Secondary | ICD-10-CM | POA: Diagnosis not present

## 2014-04-21 DIAGNOSIS — Z96659 Presence of unspecified artificial knee joint: Secondary | ICD-10-CM | POA: Diagnosis not present

## 2014-04-21 DIAGNOSIS — Z471 Aftercare following joint replacement surgery: Secondary | ICD-10-CM | POA: Diagnosis not present

## 2014-04-23 DIAGNOSIS — Z96659 Presence of unspecified artificial knee joint: Secondary | ICD-10-CM | POA: Diagnosis not present

## 2014-04-23 DIAGNOSIS — Z471 Aftercare following joint replacement surgery: Secondary | ICD-10-CM | POA: Diagnosis not present

## 2014-04-23 DIAGNOSIS — I10 Essential (primary) hypertension: Secondary | ICD-10-CM | POA: Diagnosis not present

## 2014-04-23 DIAGNOSIS — Z4801 Encounter for change or removal of surgical wound dressing: Secondary | ICD-10-CM | POA: Diagnosis not present

## 2014-04-23 DIAGNOSIS — Z7901 Long term (current) use of anticoagulants: Secondary | ICD-10-CM | POA: Diagnosis not present

## 2014-04-25 DIAGNOSIS — Z96659 Presence of unspecified artificial knee joint: Secondary | ICD-10-CM | POA: Diagnosis not present

## 2014-04-25 DIAGNOSIS — Z7901 Long term (current) use of anticoagulants: Secondary | ICD-10-CM | POA: Diagnosis not present

## 2014-04-25 DIAGNOSIS — Z471 Aftercare following joint replacement surgery: Secondary | ICD-10-CM | POA: Diagnosis not present

## 2014-04-25 DIAGNOSIS — I10 Essential (primary) hypertension: Secondary | ICD-10-CM | POA: Diagnosis not present

## 2014-04-25 DIAGNOSIS — Z4801 Encounter for change or removal of surgical wound dressing: Secondary | ICD-10-CM | POA: Diagnosis not present

## 2014-04-26 DIAGNOSIS — I10 Essential (primary) hypertension: Secondary | ICD-10-CM | POA: Diagnosis not present

## 2014-04-26 DIAGNOSIS — Z96659 Presence of unspecified artificial knee joint: Secondary | ICD-10-CM | POA: Diagnosis not present

## 2014-04-26 DIAGNOSIS — Z7901 Long term (current) use of anticoagulants: Secondary | ICD-10-CM | POA: Diagnosis not present

## 2014-04-26 DIAGNOSIS — Z471 Aftercare following joint replacement surgery: Secondary | ICD-10-CM | POA: Diagnosis not present

## 2014-04-26 DIAGNOSIS — Z4801 Encounter for change or removal of surgical wound dressing: Secondary | ICD-10-CM | POA: Diagnosis not present

## 2014-04-27 DIAGNOSIS — Z4801 Encounter for change or removal of surgical wound dressing: Secondary | ICD-10-CM | POA: Diagnosis not present

## 2014-04-27 DIAGNOSIS — I10 Essential (primary) hypertension: Secondary | ICD-10-CM | POA: Diagnosis not present

## 2014-04-27 DIAGNOSIS — Z96659 Presence of unspecified artificial knee joint: Secondary | ICD-10-CM | POA: Diagnosis not present

## 2014-04-27 DIAGNOSIS — Z7901 Long term (current) use of anticoagulants: Secondary | ICD-10-CM | POA: Diagnosis not present

## 2014-04-27 DIAGNOSIS — Z471 Aftercare following joint replacement surgery: Secondary | ICD-10-CM | POA: Diagnosis not present

## 2014-04-30 DIAGNOSIS — Z4801 Encounter for change or removal of surgical wound dressing: Secondary | ICD-10-CM | POA: Diagnosis not present

## 2014-04-30 DIAGNOSIS — Z96659 Presence of unspecified artificial knee joint: Secondary | ICD-10-CM | POA: Diagnosis not present

## 2014-04-30 DIAGNOSIS — Z7901 Long term (current) use of anticoagulants: Secondary | ICD-10-CM | POA: Diagnosis not present

## 2014-04-30 DIAGNOSIS — I10 Essential (primary) hypertension: Secondary | ICD-10-CM | POA: Diagnosis not present

## 2014-04-30 DIAGNOSIS — Z471 Aftercare following joint replacement surgery: Secondary | ICD-10-CM | POA: Diagnosis not present

## 2014-05-02 DIAGNOSIS — Z96659 Presence of unspecified artificial knee joint: Secondary | ICD-10-CM | POA: Diagnosis not present

## 2014-05-02 DIAGNOSIS — I10 Essential (primary) hypertension: Secondary | ICD-10-CM | POA: Diagnosis not present

## 2014-05-02 DIAGNOSIS — Z4801 Encounter for change or removal of surgical wound dressing: Secondary | ICD-10-CM | POA: Diagnosis not present

## 2014-05-02 DIAGNOSIS — Z471 Aftercare following joint replacement surgery: Secondary | ICD-10-CM | POA: Diagnosis not present

## 2014-05-02 DIAGNOSIS — Z7901 Long term (current) use of anticoagulants: Secondary | ICD-10-CM | POA: Diagnosis not present

## 2014-05-03 DIAGNOSIS — M25569 Pain in unspecified knee: Secondary | ICD-10-CM | POA: Diagnosis not present

## 2014-05-07 DIAGNOSIS — M25569 Pain in unspecified knee: Secondary | ICD-10-CM | POA: Diagnosis not present

## 2014-05-09 DIAGNOSIS — M25569 Pain in unspecified knee: Secondary | ICD-10-CM | POA: Diagnosis not present

## 2014-05-11 DIAGNOSIS — M25569 Pain in unspecified knee: Secondary | ICD-10-CM | POA: Diagnosis not present

## 2014-05-14 DIAGNOSIS — M25569 Pain in unspecified knee: Secondary | ICD-10-CM | POA: Diagnosis not present

## 2014-05-16 DIAGNOSIS — M25569 Pain in unspecified knee: Secondary | ICD-10-CM | POA: Diagnosis not present

## 2014-05-17 DIAGNOSIS — Z96659 Presence of unspecified artificial knee joint: Secondary | ICD-10-CM | POA: Diagnosis not present

## 2014-05-17 DIAGNOSIS — Z471 Aftercare following joint replacement surgery: Secondary | ICD-10-CM | POA: Diagnosis not present

## 2014-05-18 DIAGNOSIS — M25569 Pain in unspecified knee: Secondary | ICD-10-CM | POA: Diagnosis not present

## 2014-05-25 DIAGNOSIS — M25569 Pain in unspecified knee: Secondary | ICD-10-CM | POA: Diagnosis not present

## 2014-05-29 DIAGNOSIS — M25569 Pain in unspecified knee: Secondary | ICD-10-CM | POA: Diagnosis not present

## 2014-06-01 DIAGNOSIS — M25569 Pain in unspecified knee: Secondary | ICD-10-CM | POA: Diagnosis not present

## 2014-06-05 DIAGNOSIS — M25569 Pain in unspecified knee: Secondary | ICD-10-CM | POA: Diagnosis not present

## 2014-06-11 DIAGNOSIS — M25569 Pain in unspecified knee: Secondary | ICD-10-CM | POA: Diagnosis not present

## 2014-06-15 DIAGNOSIS — M25569 Pain in unspecified knee: Secondary | ICD-10-CM | POA: Diagnosis not present

## 2014-06-19 DIAGNOSIS — M25569 Pain in unspecified knee: Secondary | ICD-10-CM | POA: Diagnosis not present

## 2014-07-04 ENCOUNTER — Other Ambulatory Visit: Payer: Self-pay

## 2014-07-04 DIAGNOSIS — E782 Mixed hyperlipidemia: Secondary | ICD-10-CM | POA: Diagnosis not present

## 2014-07-04 DIAGNOSIS — Z96659 Presence of unspecified artificial knee joint: Secondary | ICD-10-CM | POA: Diagnosis not present

## 2014-07-04 DIAGNOSIS — M949 Disorder of cartilage, unspecified: Secondary | ICD-10-CM | POA: Diagnosis not present

## 2014-07-04 DIAGNOSIS — Z1231 Encounter for screening mammogram for malignant neoplasm of breast: Secondary | ICD-10-CM

## 2014-07-04 DIAGNOSIS — Z23 Encounter for immunization: Secondary | ICD-10-CM | POA: Diagnosis not present

## 2014-07-04 DIAGNOSIS — M899 Disorder of bone, unspecified: Secondary | ICD-10-CM | POA: Diagnosis not present

## 2014-07-04 DIAGNOSIS — I10 Essential (primary) hypertension: Secondary | ICD-10-CM | POA: Diagnosis not present

## 2014-07-17 ENCOUNTER — Encounter (INDEPENDENT_AMBULATORY_CARE_PROVIDER_SITE_OTHER): Payer: Self-pay

## 2014-07-17 ENCOUNTER — Ambulatory Visit
Admission: RE | Admit: 2014-07-17 | Discharge: 2014-07-17 | Disposition: A | Discharge status: Home / Self Care | Payer: Medicare Other | Source: Ambulatory Visit

## 2014-07-17 DIAGNOSIS — Z1231 Encounter for screening mammogram for malignant neoplasm of breast: Secondary | ICD-10-CM

## 2014-10-18 DIAGNOSIS — Z471 Aftercare following joint replacement surgery: Secondary | ICD-10-CM | POA: Diagnosis not present

## 2014-10-18 DIAGNOSIS — Z96651 Presence of right artificial knee joint: Secondary | ICD-10-CM | POA: Diagnosis not present

## 2014-10-26 DIAGNOSIS — Z23 Encounter for immunization: Secondary | ICD-10-CM | POA: Diagnosis not present

## 2014-12-14 DIAGNOSIS — J029 Acute pharyngitis, unspecified: Secondary | ICD-10-CM | POA: Diagnosis not present

## 2015-02-08 DIAGNOSIS — S81012A Laceration without foreign body, left knee, initial encounter: Secondary | ICD-10-CM | POA: Diagnosis not present

## 2015-02-14 DIAGNOSIS — T148 Other injury of unspecified body region: Secondary | ICD-10-CM | POA: Diagnosis not present

## 2015-02-28 DIAGNOSIS — T148 Other injury of unspecified body region: Secondary | ICD-10-CM | POA: Diagnosis not present

## 2015-03-01 DIAGNOSIS — S81012A Laceration without foreign body, left knee, initial encounter: Secondary | ICD-10-CM | POA: Diagnosis not present

## 2015-03-07 DIAGNOSIS — S81012D Laceration without foreign body, left knee, subsequent encounter: Secondary | ICD-10-CM | POA: Diagnosis not present

## 2015-03-21 DIAGNOSIS — S81012D Laceration without foreign body, left knee, subsequent encounter: Secondary | ICD-10-CM | POA: Diagnosis not present

## 2015-04-30 DIAGNOSIS — Z96651 Presence of right artificial knee joint: Secondary | ICD-10-CM | POA: Diagnosis not present

## 2015-04-30 DIAGNOSIS — Z471 Aftercare following joint replacement surgery: Secondary | ICD-10-CM | POA: Diagnosis not present

## 2015-04-30 DIAGNOSIS — S81012D Laceration without foreign body, left knee, subsequent encounter: Secondary | ICD-10-CM | POA: Diagnosis not present

## 2015-05-01 DIAGNOSIS — N390 Urinary tract infection, site not specified: Secondary | ICD-10-CM | POA: Diagnosis not present

## 2015-05-17 DIAGNOSIS — N390 Urinary tract infection, site not specified: Secondary | ICD-10-CM | POA: Diagnosis not present

## 2015-05-17 DIAGNOSIS — R300 Dysuria: Secondary | ICD-10-CM | POA: Diagnosis not present

## 2015-05-30 DIAGNOSIS — H179 Unspecified corneal scar and opacity: Secondary | ICD-10-CM | POA: Diagnosis not present

## 2015-05-30 DIAGNOSIS — H35363 Drusen (degenerative) of macula, bilateral: Secondary | ICD-10-CM | POA: Diagnosis not present

## 2015-05-30 DIAGNOSIS — H04129 Dry eye syndrome of unspecified lacrimal gland: Secondary | ICD-10-CM | POA: Diagnosis not present

## 2015-05-30 DIAGNOSIS — H1852 Epithelial (juvenile) corneal dystrophy: Secondary | ICD-10-CM | POA: Diagnosis not present

## 2015-05-30 DIAGNOSIS — Z961 Presence of intraocular lens: Secondary | ICD-10-CM | POA: Diagnosis not present

## 2015-07-03 DIAGNOSIS — M5032 Other cervical disc degeneration, mid-cervical region: Secondary | ICD-10-CM | POA: Diagnosis not present

## 2015-07-10 ENCOUNTER — Other Ambulatory Visit: Payer: Self-pay

## 2015-07-10 DIAGNOSIS — Z1231 Encounter for screening mammogram for malignant neoplasm of breast: Secondary | ICD-10-CM

## 2015-07-24 DIAGNOSIS — Z96651 Presence of right artificial knee joint: Secondary | ICD-10-CM | POA: Diagnosis not present

## 2015-07-24 DIAGNOSIS — I10 Essential (primary) hypertension: Secondary | ICD-10-CM | POA: Diagnosis not present

## 2015-07-24 DIAGNOSIS — Z0000 Encounter for general adult medical examination without abnormal findings: Secondary | ICD-10-CM | POA: Diagnosis not present

## 2015-07-24 DIAGNOSIS — E782 Mixed hyperlipidemia: Secondary | ICD-10-CM | POA: Diagnosis not present

## 2015-08-12 ENCOUNTER — Ambulatory Visit: Payer: BLUE CROSS/BLUE SHIELD

## 2015-09-04 ENCOUNTER — Ambulatory Visit
Admission: RE | Admit: 2015-09-04 | Discharge: 2015-09-04 | Disposition: A | Discharge status: Home / Self Care | Payer: Medicare Other | Source: Ambulatory Visit

## 2015-09-04 DIAGNOSIS — Z1231 Encounter for screening mammogram for malignant neoplasm of breast: Secondary | ICD-10-CM

## 2015-10-06 IMAGING — CR DG CHEST 2V
2 series · 2 of 2 positions shown · non-contrast
Comparison: None.

CLINICAL DATA: Preoperative radiograph. Right total knee
arthroplasty.

EXAM:
CHEST  2 VIEW

[w chest pa]
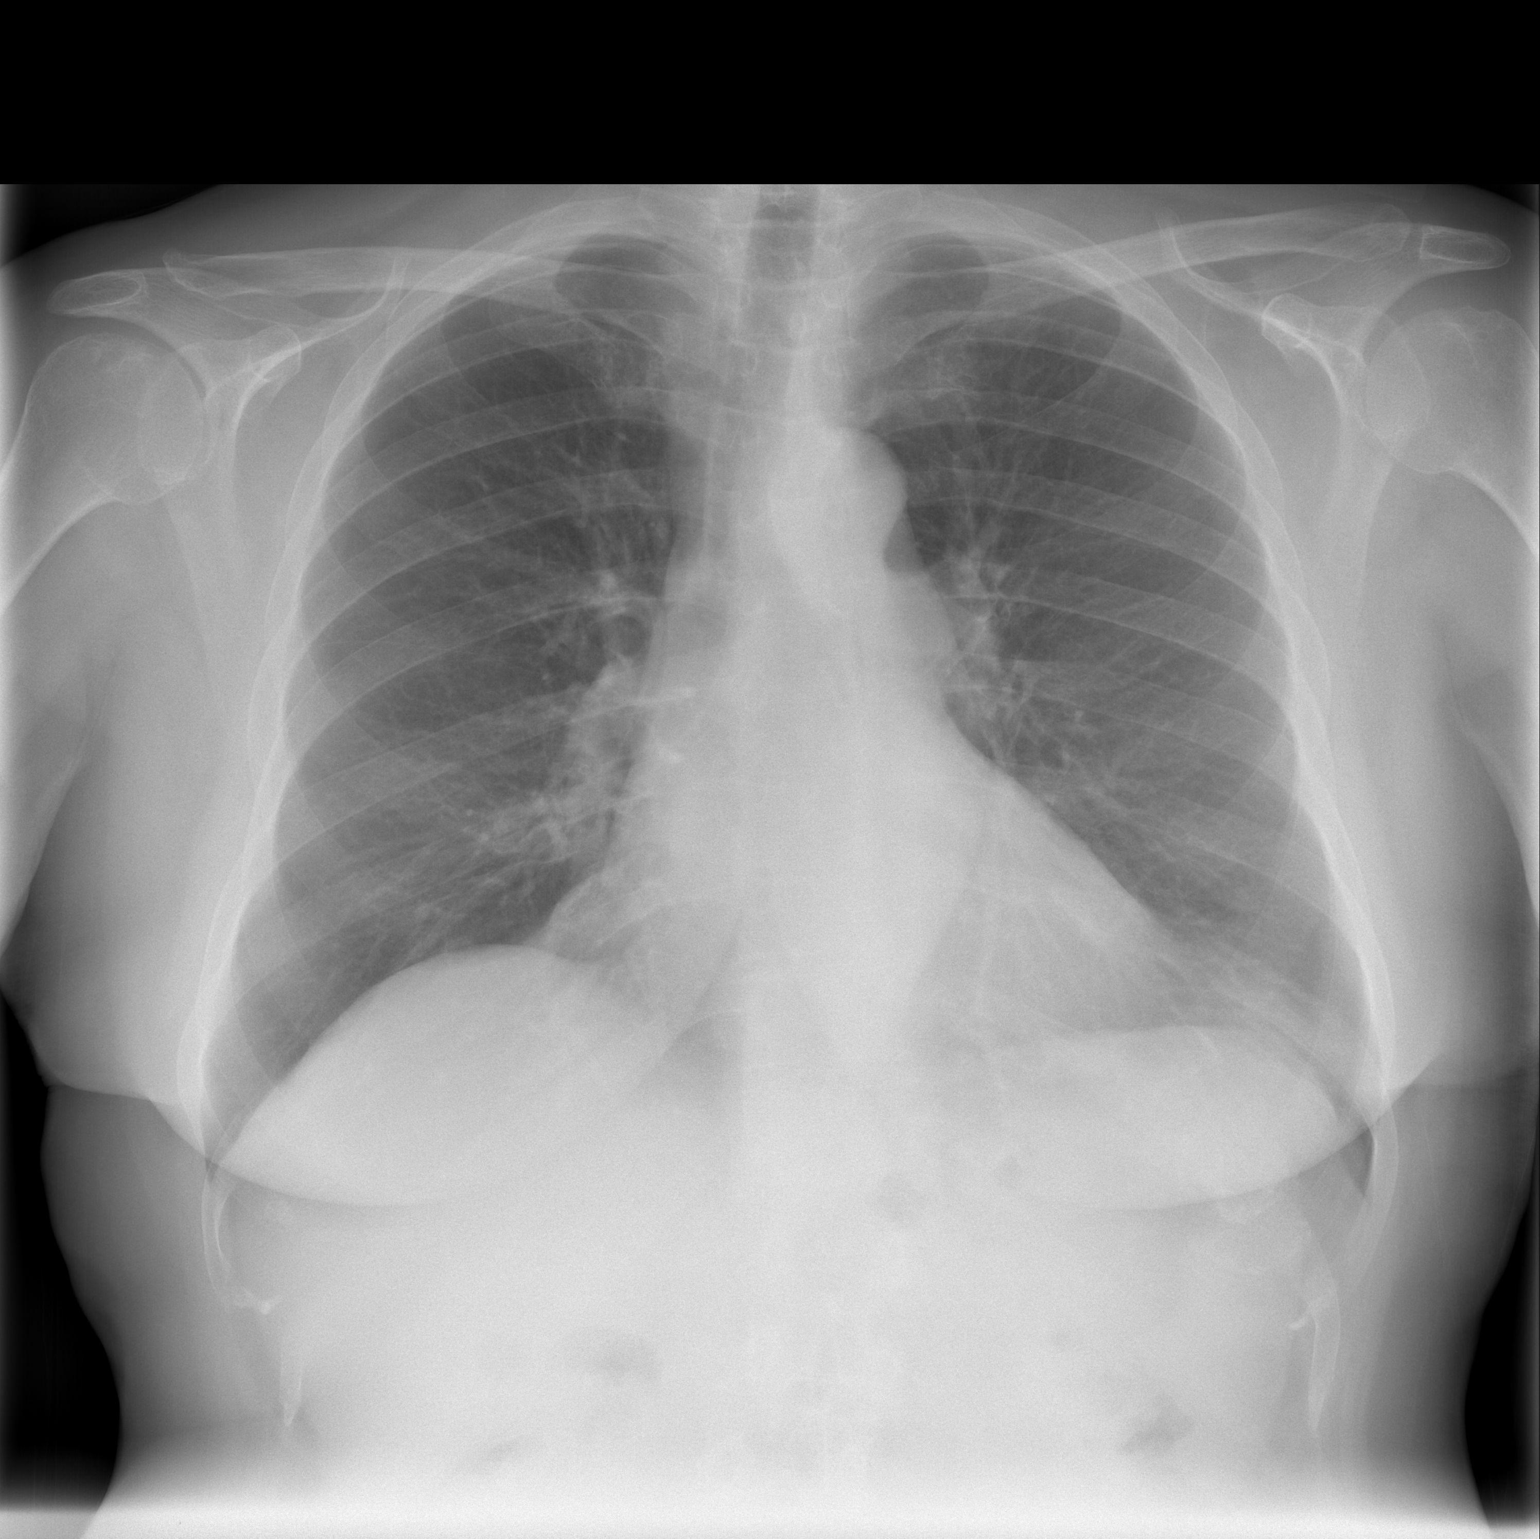

[w chest lat]
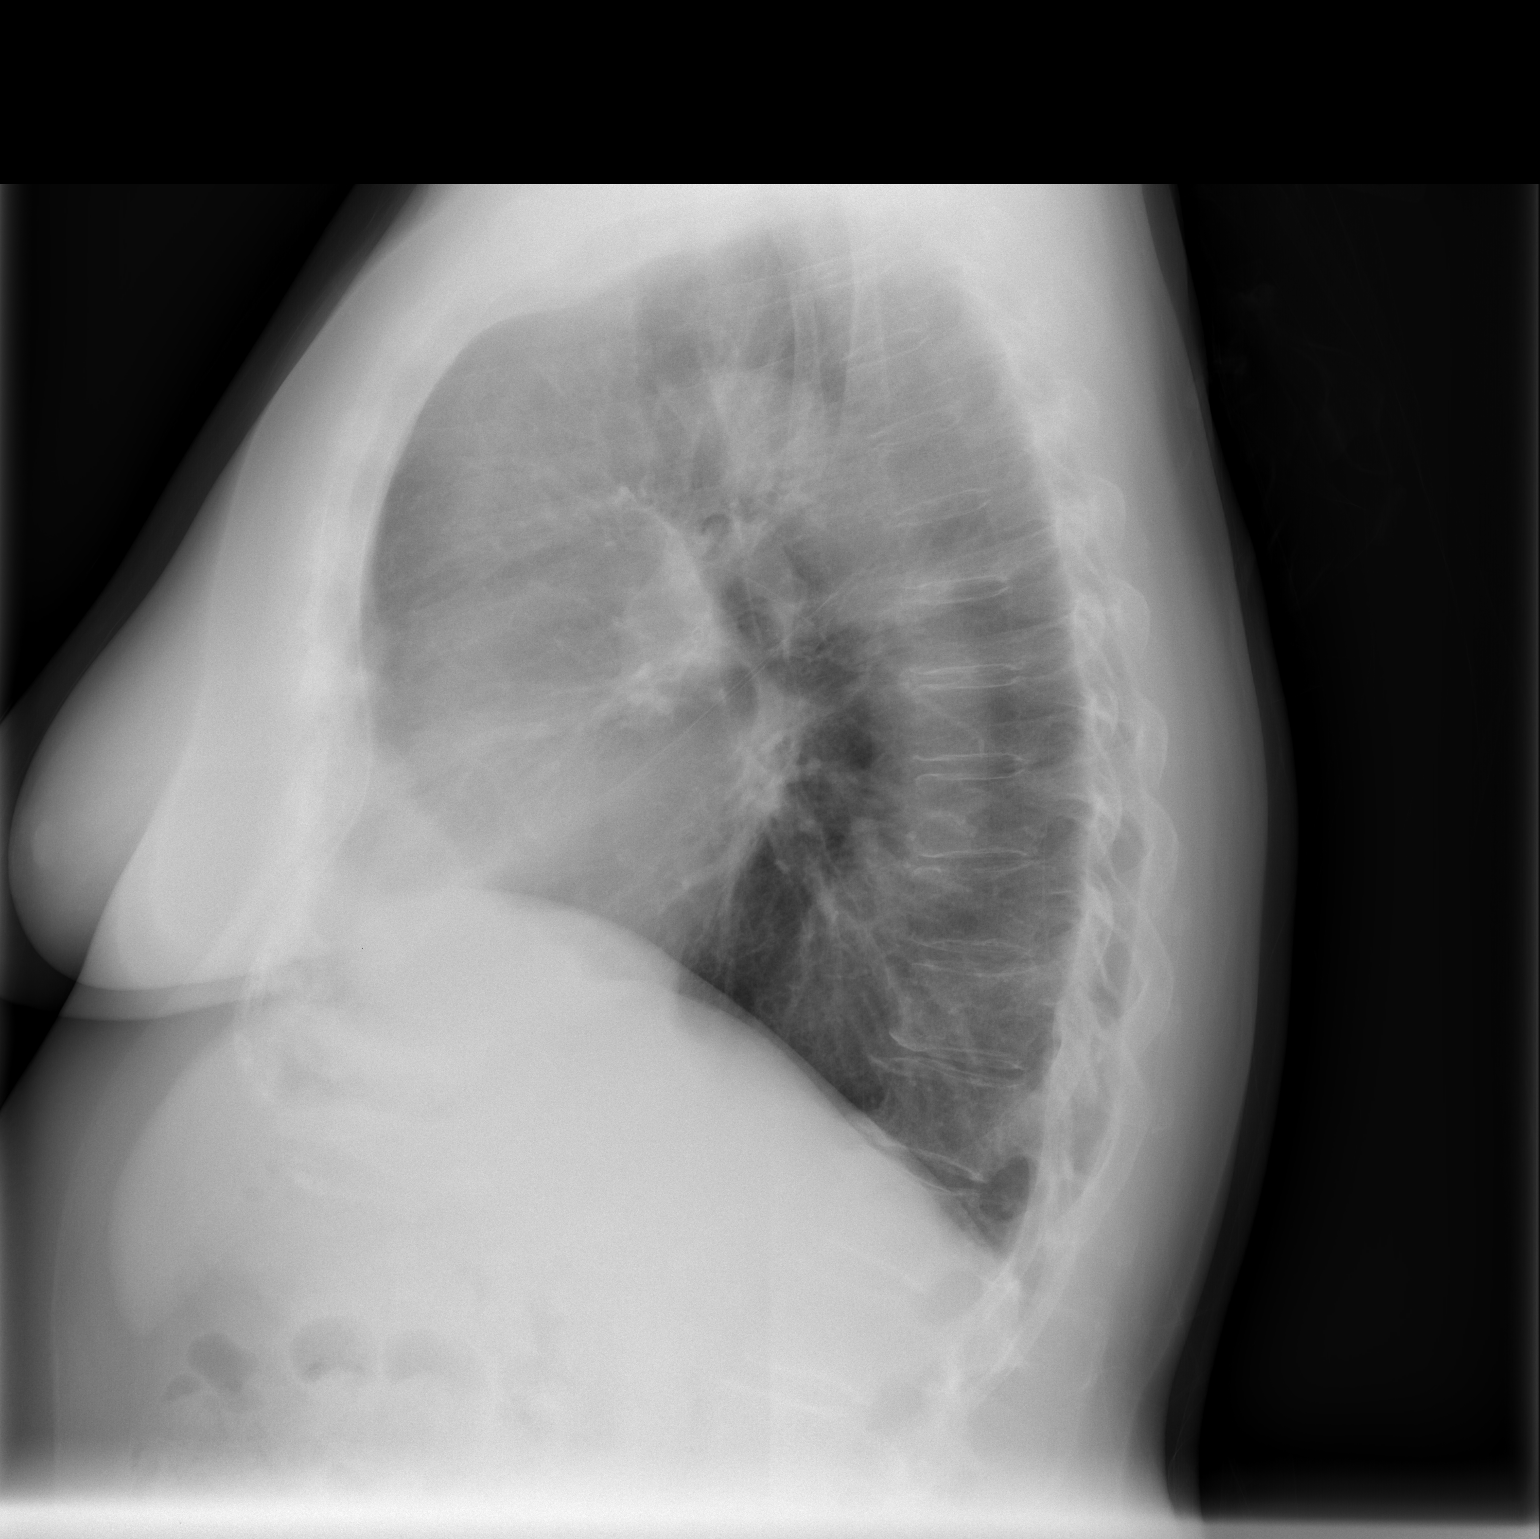

[2 of 2 positions shown; findings below may reference images not displayed]

FINDINGS: The heart size and mediastinal contours are within normal limits.
Both lungs are clear. The visualized skeletal structures are
unremarkable.
IMPRESSION: No active cardiopulmonary disease.

## 2015-11-11 DIAGNOSIS — Z23 Encounter for immunization: Secondary | ICD-10-CM | POA: Diagnosis not present

## 2016-01-06 DIAGNOSIS — E785 Hyperlipidemia, unspecified: Secondary | ICD-10-CM | POA: Diagnosis not present

## 2016-01-06 DIAGNOSIS — I10 Essential (primary) hypertension: Secondary | ICD-10-CM | POA: Diagnosis not present

## 2016-01-06 DIAGNOSIS — E668 Other obesity: Secondary | ICD-10-CM | POA: Diagnosis not present

## 2016-01-06 DIAGNOSIS — K219 Gastro-esophageal reflux disease without esophagitis: Secondary | ICD-10-CM | POA: Diagnosis not present

## 2016-01-06 DIAGNOSIS — R011 Cardiac murmur, unspecified: Secondary | ICD-10-CM | POA: Diagnosis not present

## 2016-01-06 DIAGNOSIS — I872 Venous insufficiency (chronic) (peripheral): Secondary | ICD-10-CM | POA: Diagnosis not present

## 2016-01-20 DIAGNOSIS — R9431 Abnormal electrocardiogram [ECG] [EKG]: Secondary | ICD-10-CM | POA: Diagnosis not present

## 2016-02-05 ENCOUNTER — Encounter: Payer: Self-pay | Admitting: Vascular Surgery

## 2016-02-06 ENCOUNTER — Ambulatory Visit (INDEPENDENT_AMBULATORY_CARE_PROVIDER_SITE_OTHER): Payer: Medicare Other | Admitting: Vascular Surgery

## 2016-02-06 ENCOUNTER — Encounter: Payer: Self-pay | Admitting: Vascular Surgery

## 2016-02-06 VITALS — BP 140/83 | HR 90 | Temp 96.9°F | Resp 16 | Ht 65.0 in | Wt 192.0 lb

## 2016-02-06 DIAGNOSIS — I83893 Varicose veins of bilateral lower extremities with other complications: Secondary | ICD-10-CM | POA: Diagnosis not present

## 2016-02-06 NOTE — Progress Notes (Signed)
Vascular and Vein Specialist of [facility]  Patient name: Beverly Moreno MRN: [id number] DOB: 1941-11-05 Sex: female  REASON FOR CONSULT: Bilateral lower extremity varicosities with pain and swelling  HPI: Beverly Moreno is a 75 y.o. female, who is seen today for evaluation of bilateral lower extremity venous hypertension. She is a very pleasant active healthy female noted to have increased swelling and discomfort related to this. She does have a history of prior right knee replacement 2 years ago. She feels that the swelling is somewhat oral the right than on the left. She has some swelling when she first arises in the morning but this becomes markedly progressive throughout the day. She has no history of DVT. She is on daily aspirin therapy. She does wear compression garments and reports no significant relief associated with this. She has difficulty putting these on.  Past Medical History  Diagnosis Date  . Hypertension   . Arthritis   . Varicose veins     Family History  Problem Relation Age of Onset  . Heart attack Mother   . Aneurysm Father     SOCIAL HISTORY: Social History   Social History  . Marital Status: Married    Spouse Name: N/A  . Number of Children: N/A  . Years of Education: N/A   Occupational History  . Not on file.   Social History Main Topics  . Smoking status: Former Smoker    Quit date: 02/06/1979  . Smokeless tobacco: Never Used  . Alcohol Use: 0.0 oz/week    0 Standard drinks or equivalent per week     Comment: wine weekly  . Drug Use: No  . Sexual Activity: Not on file   Other Topics Concern  . Not on file   Social History Narrative    No Known Allergies  Current Outpatient Prescriptions  Medication Sig Dispense Refill  . aspirin 81 MG tablet Take 81 mg by mouth daily.    Marland Kitchen atorvastatin (LIPITOR) 20 MG tablet Take 20 mg by mouth daily.    Marland Kitchen olmesartan-hydrochlorothiazide (BENICAR HCT) 20-12.5 MG per tablet Take 1 tablet by mouth  every morning.    Marland Kitchen oxyCODONE (OXY IR/ROXICODONE) 5 MG immediate release tablet Take 1-2 tablets (5-10 mg total) by mouth every 3 (three) hours as needed for moderate pain, severe pain or breakthrough pain. 80 tablet 0  . methocarbamol (ROBAXIN) 500 MG tablet Take 1 tablet (500 mg total) by mouth every 6 (six) hours as needed for muscle spasms. (Patient not taking: Reported on 02/06/2016) 80 tablet 0  . rivaroxaban (XARELTO) 10 MG TABS tablet Take 1 tablet (10 mg total) by mouth daily with breakfast. Take Xarelto for two and a half more weeks, then discontinue Xarelto. Once the patient has completed the Xarelto, they may resume the 81 mg Aspirin. (Patient not taking: Reported on 02/06/2016) 19 tablet 0  . traMADol (ULTRAM) 50 MG tablet Take 1-2 tablets (50-100 mg total) by mouth every 6 (six) hours as needed (mild to moderate pain). (Patient not taking: Reported on 02/06/2016) 60 tablet 1   No current facility-administered medications for this visit.    REVIEW OF SYSTEMS:  [X]  denotes positive finding, [ ]  denotes negative finding Cardiac  Comments:  Chest pain or chest pressure:    Shortness of breath upon exertion:    Short of breath when lying flat:    Irregular heart rhythm:        Vascular    Pain in calf, thigh, or hip brought on  by ambulation: x   Pain in feet at night that wakes you up from your sleep:     Blood clot in your veins:    Leg swelling:  x       Pulmonary    Oxygen at home:    Productive cough:     Wheezing:         Neurologic    Sudden weakness in arms or legs:     Sudden numbness in arms or legs:     Sudden onset of difficulty speaking or slurred speech:    Temporary loss of vision in one eye:     Problems with dizziness:         Gastrointestinal    Blood in stool:     Vomited blood:         Genitourinary    Burning when urinating:     Blood in urine:        Psychiatric    Major depression:         Hematologic    Bleeding problems:    Problems with  blood clotting too easily:        Skin    Rashes or ulcers:        Constitutional    Fever or chills:      PHYSICAL EXAM: Filed Vitals:   02/06/16 1101  BP: 140/83  Pulse: 90  Temp: 96.9 F (36.1 C)  Resp: 16  Height: 5\' 5"  (1.651 m)  Weight: 192 lb (87.091 kg)  SpO2: 97%    GENERAL: The patient is a well-nourished female, in no acute distress. The vital signs are documented above. VASCULAR: 2+ radial pulses bilaterally PULMONARY: There is good air exchange  MUSCULOSKELETAL: There are no major deformities or cyanosis. Her anterior knee from prior right knee replacement NEUROLOGIC: No focal weakness or paresthesias are detected. SKIN: There are no ulcers or rashes noted. PSYCHIATRIC: The patient has a normal affect. Varicosities noted on her medial right calf and extensive telangiectasia on her medial ankle. Scattered telangiectasia on both legs  DATA:  Formal venous duplex with reflux study is pending I did image her bilateral saphenous veins with SonoSite ultrasound. These do appear to be somewhat dilated and she does have a branch extending into the dilated varicosities in her right medial calf  MEDICAL ISSUES: Long discussion with patient regarding her leg swelling and a possible venous hypertension is causing this. I have recommended formal venous duplex and further evaluation with her. I suspect that this is a component of both deep and superficial reflux. If this has a significant component of superficial reflux, she may benefit from ablation of her saphenous vein. We will make further recommendation depending up on her reflux venous studies. I reassured her that with normal arterial pulses that she has no risk for limb threatening difficulties.   Curt Jews [facility]ists of [place]: [phone]

## 2016-02-06 NOTE — Addendum Note (Signed)
Addended by: Dorthula Rue L on: 02/06/2016 05:25 PM   Modules accepted: Orders

## 2016-02-18 ENCOUNTER — Encounter: Payer: Self-pay | Admitting: Vascular Surgery

## 2016-02-18 ENCOUNTER — Other Ambulatory Visit: Payer: Self-pay | Admitting: *Deleted

## 2016-02-18 DIAGNOSIS — I83893 Varicose veins of bilateral lower extremities with other complications: Secondary | ICD-10-CM

## 2016-02-24 ENCOUNTER — Ambulatory Visit (HOSPITAL_COMMUNITY)
Admission: RE | Admit: 2016-02-24 | Discharge: 2016-02-24 | Disposition: A | Discharge status: Home / Self Care | Payer: Medicare Other | Source: Ambulatory Visit | Attending: Surgery | Admitting: Surgery

## 2016-02-24 DIAGNOSIS — E785 Hyperlipidemia, unspecified: Secondary | ICD-10-CM | POA: Insufficient documentation

## 2016-02-24 DIAGNOSIS — I10 Essential (primary) hypertension: Secondary | ICD-10-CM | POA: Insufficient documentation

## 2016-02-24 DIAGNOSIS — R609 Edema, unspecified: Secondary | ICD-10-CM | POA: Diagnosis present

## 2016-02-24 DIAGNOSIS — I83893 Varicose veins of bilateral lower extremities with other complications: Secondary | ICD-10-CM | POA: Diagnosis not present

## 2016-02-26 ENCOUNTER — Telehealth: Payer: Self-pay | Admitting: *Deleted

## 2016-02-26 NOTE — Telephone Encounter (Signed)
After reviewing ultrasound results with Dr. Donnetta Hutching on 02-25-3016, called and left message (with her husband) for Beverly Moreno  to call me for venous reflux results.  Spoke with Beverly Moreno this morning (02-26-2016) with venous reflux results (bilteral lower extremity reflux and venous incompetence of right and left greater saphenous veins).  Conveyed Dr. Luther Parody recommendation to schedule appointment for 3 month VV follow up with him to discuss possible laser ablation procedures.  Beverly Moreno started wearing thigh high compression hose 20-30 mm HG on 01-06-2016 per Dr. Thurman Coyer office note.  She elevates her legs frequently and takes Ibuprofen for leg pain.  Beverly Moreno verbalized understanding and wants to schedule 3 month VV follow up visit with Dr. Donnetta Hutching.  Will notify scheduler of need for follow up appointment with Dr. Donnetta Hutching on 03-31-2016 (no lab needed) and request scheduler to call Beverly Moreno with appointment date and time.

## 2016-02-27 ENCOUNTER — Telehealth: Payer: Self-pay | Admitting: Vascular Surgery

## 2016-02-27 NOTE — Telephone Encounter (Signed)
LM for pt with appt date/time, asked that she call to confirm, dpm

## 2016-02-27 NOTE — Telephone Encounter (Signed)
-----   Message from Rica Records, RN sent at 02/26/2016 10:05 AM EST ----- Regarding: scheduling Please schedule Beverly Moreno for a 3 month VV FU with Dr. Donnetta Hutching on 03-31-2016.  (no lab needed.)  She can be reached at [phone].  Thanks!

## 2016-03-20 ENCOUNTER — Encounter: Payer: Self-pay | Admitting: Vascular Surgery

## 2016-03-24 ENCOUNTER — Encounter: Payer: Medicare Other | Admitting: Vascular Surgery

## 2016-03-24 ENCOUNTER — Encounter (HOSPITAL_COMMUNITY): Payer: Medicare Other

## 2016-03-31 ENCOUNTER — Encounter: Payer: Self-pay | Admitting: Vascular Surgery

## 2016-03-31 ENCOUNTER — Ambulatory Visit (INDEPENDENT_AMBULATORY_CARE_PROVIDER_SITE_OTHER): Payer: Medicare Other | Admitting: Vascular Surgery

## 2016-03-31 VITALS — BP 123/67 | HR 57 | Temp 97.0°F | Resp 18 | Ht 63.0 in | Wt 190.6 lb

## 2016-03-31 DIAGNOSIS — I83893 Varicose veins of bilateral lower extremities with other complications: Secondary | ICD-10-CM | POA: Diagnosis not present

## 2016-03-31 NOTE — Progress Notes (Signed)
Problems with Activities of Daily Living Secondary to Leg Pain  1. Beverly Moreno states that any activities that requires prolonged standing (Cooking, shopping, cleaning) are very difficult due to leg pain.   2. Beverly Moreno states that travel (especially international flights) are very difficult due to lefg pain.      Failure of  Conservative Therapy:  1. Worn 20-30 mm Hg thigh high compression hose >3 months with no relief of symptoms.  2. Frequently elevates legs-no relief of symptoms  3. Taken Ibuprofen 600 Mg TID with no relief of symptoms.    Vascular and Vein Specialist of [facility]  Patient name: Beverly Moreno MRN: [id number] DOB: Sep 29, 1941 Sex: female  REASON FOR VISIT: Follow-up symptomatic bilateral venous hypertension  HPI: Beverly Moreno is a 75 y.o. female here today for follow-up of symptomatic venous hypertension bilaterally. This is worse on the right than the left. She did have a recent fall striking her right knee. She does have a bruise over this and has had prior right knee replacement. She does not have to seem to have any difficulty other than superficial bruise. She has been compliant with her compression garment elevation but continues to have difficulty with swelling and discomfort. She is very active and this is limiting her daily activities  Past Medical History  Diagnosis Date  . Hypertension   . Arthritis   . Varicose veins   . Heart murmur, systolic   . Obesity   . Hyperlipidemia   . GERD (gastroesophageal reflux disease)     Family History  Problem Relation Age of Onset  . Heart attack Mother   . Parkinsonism Mother   . Alzheimer's disease Mother   . Aneurysm Father   . Hypertension Father     SOCIAL HISTORY: Social History  Substance Use Topics  . Smoking status: Former Smoker    Quit date: 02/06/1979  . Smokeless tobacco: Never Used  . Alcohol Use: 0.0 oz/week    0 Standard drinks or equivalent per week     Comment: wine  weekly    No Known Allergies  Current Outpatient Prescriptions  Medication Sig Dispense Refill  . aspirin 81 MG tablet Take 81 mg by mouth daily.    Marland Kitchen atorvastatin (LIPITOR) 20 MG tablet Take 20 mg by mouth daily.    . calcium carbonate 1250 MG capsule Take 1,250 mg by mouth daily.    . Cholecalciferol (VITAMIN D3) 2000 units TABS Take by mouth daily.    Marland Kitchen ibuprofen (ADVIL,MOTRIN) 200 MG tablet Take 200 mg by mouth every 6 (six) hours as needed.    Marland Kitchen olmesartan-hydrochlorothiazide (BENICAR HCT) 20-12.5 MG per tablet Take 1 tablet by mouth every morning.    . Biotin 300 MCG TABS Take by mouth daily. Reported on 03/31/2016    . methocarbamol (ROBAXIN) 500 MG tablet Take 1 tablet (500 mg total) by mouth every 6 (six) hours as needed for muscle spasms. (Patient not taking: Reported on 02/06/2016) 80 tablet 0  . Multiple Vitamin (MULTIVITAMIN) tablet Take 1 tablet by mouth daily. Reported on 03/31/2016    . Omega-3 Fatty Acids (FISH OIL) 1000 MG CAPS Take by mouth daily. Reported on 03/31/2016    . oxyCODONE (OXY IR/ROXICODONE) 5 MG immediate release tablet Take 1-2 tablets (5-10 mg total) by mouth every 3 (three) hours as needed for moderate pain, severe pain or breakthrough pain. (Patient not taking: Reported on 03/31/2016) 80 tablet 0  . rivaroxaban (XARELTO) 10 MG TABS tablet Take 1 tablet (10  mg total) by mouth daily with breakfast. Take Xarelto for two and a half more weeks, then discontinue Xarelto. Once the patient has completed the Xarelto, they may resume the 81 mg Aspirin. (Patient not taking: Reported on 02/06/2016) 19 tablet 0  . Spirulina 500 MG TABS Take by mouth daily. Reported on 03/31/2016    . traMADol (ULTRAM) 50 MG tablet Take 1-2 tablets (50-100 mg total) by mouth every 6 (six) hours as needed (mild to moderate pain). (Patient not taking: Reported on 02/06/2016) 60 tablet 1   No current facility-administered medications for this visit.    REVIEW OF SYSTEMS:  [X]  denotes positive finding,  [ ]  denotes negative finding Cardiac  Comments:  Chest pain or chest pressure:    Shortness of breath upon exertion:    Short of breath when lying flat:    Irregular heart rhythm:        Vascular    Pain in calf, thigh, or hip brought on by ambulation: x   Pain in feet at night that wakes you up from your sleep:     Blood clot in your veins:    Leg swelling:  x       Pulmonary    Oxygen at home:    Productive cough:     Wheezing:         Neurologic    Sudden weakness in arms or legs:     Sudden numbness in arms or legs:     Sudden onset of difficulty speaking or slurred speech:    Temporary loss of vision in one eye:     Problems with dizziness:         Gastrointestinal    Blood in stool:     Vomited blood:         Genitourinary    Burning when urinating:     Blood in urine:        Psychiatric    Major depression:         Hematologic    Bleeding problems:    Problems with blood clotting too easily:        Skin    Rashes or ulcers:        Constitutional    Fever or chills:      PHYSICAL EXAM: Filed Vitals:   03/31/16 0913  BP: 123/67  Pulse: 57  Temp: 97 F (36.1 C)  TempSrc: Oral  Resp: 18  Height: 5\' 3"  (1.6 m)  Weight: 190 lb 9.6 oz (86.456 kg)  SpO2: 95%    GENERAL: The patient is a well-nourished female, in no acute distress. The vital signs are documented above.Marland Kitchen  VASCULAR: 2+ dorsalis pedis pulses bilaterally PULMONARY: There is good air exchange  MUSCULOSKELETAL: There are no major deformities or cyanosis. Bruising over her right knee with scar from prior right knee replacement intact NEUROLOGIC: No focal weakness or paresthesias are detected. SKIN: There are no ulcers or rashes noted. PSYCHIATRIC: The patient has a normal affect. Moderate swelling in both lower extremities. No significant varicosities DATA:  Reviewed her formal duplex from February. Also reimage both lower extremities with SonoSite ultrasound. Does have enlargement in both  saphenous veins and formal duplex showed reflux in her superficial system in the great saphenous vein bilaterally and no deep venous incompetence  MEDICAL ISSUES: Failed conservative therapy for bilateral lower from the venous hypertension related to superficial venous reflux. Her right leg is more symptomatic than her left. I have recommended staged bilateral laser  ablation of her great saphenous vein. Explained the procedure as an outpatient under local anesthesia. Also explained the very slight risk of DVT associated with the procedure. She has a graduation of her granddaughter in [place] in May and wishes to schedule her right leg to follow this. We'll plan on left leg ablation depending on her results from the right leg No Follow-up on file.   Curt Jews [facility] and Vein Specialists of [place]: [phone]

## 2016-03-31 NOTE — Addendum Note (Signed)
Addended by: Thresa Ross C on: 03/31/2016 11:00 AM   Modules accepted: Orders, Medications

## 2016-05-05 DIAGNOSIS — Z96651 Presence of right artificial knee joint: Secondary | ICD-10-CM | POA: Diagnosis not present

## 2016-05-05 DIAGNOSIS — Z471 Aftercare following joint replacement surgery: Secondary | ICD-10-CM | POA: Diagnosis not present

## 2016-05-05 DIAGNOSIS — M7631 Iliotibial band syndrome, right leg: Secondary | ICD-10-CM | POA: Diagnosis not present

## 2016-06-01 DIAGNOSIS — H1852 Epithelial (juvenile) corneal dystrophy: Secondary | ICD-10-CM | POA: Diagnosis not present

## 2016-06-01 DIAGNOSIS — H43393 Other vitreous opacities, bilateral: Secondary | ICD-10-CM | POA: Diagnosis not present

## 2016-06-01 DIAGNOSIS — H35363 Drusen (degenerative) of macula, bilateral: Secondary | ICD-10-CM | POA: Diagnosis not present

## 2016-06-01 DIAGNOSIS — H11441 Conjunctival cysts, right eye: Secondary | ICD-10-CM | POA: Diagnosis not present

## 2016-06-22 DIAGNOSIS — J029 Acute pharyngitis, unspecified: Secondary | ICD-10-CM | POA: Diagnosis not present

## 2016-06-23 DIAGNOSIS — Z471 Aftercare following joint replacement surgery: Secondary | ICD-10-CM | POA: Diagnosis not present

## 2016-06-23 DIAGNOSIS — M7631 Iliotibial band syndrome, right leg: Secondary | ICD-10-CM | POA: Diagnosis not present

## 2016-06-23 DIAGNOSIS — Z96651 Presence of right artificial knee joint: Secondary | ICD-10-CM | POA: Diagnosis not present

## 2016-06-24 ENCOUNTER — Other Ambulatory Visit (HOSPITAL_COMMUNITY): Payer: Self-pay | Admitting: Orthopedic Surgery

## 2016-06-24 DIAGNOSIS — M7631 Iliotibial band syndrome, right leg: Secondary | ICD-10-CM

## 2016-07-06 ENCOUNTER — Encounter (HOSPITAL_COMMUNITY)
Admission: RE | Admit: 2016-07-06 | Discharge: 2016-07-06 | Disposition: A | Discharge status: Home / Self Care | Payer: Medicare Other | Source: Ambulatory Visit | Attending: Orthopedic Surgery | Admitting: Orthopedic Surgery

## 2016-07-06 DIAGNOSIS — R948 Abnormal results of function studies of other organs and systems: Secondary | ICD-10-CM | POA: Diagnosis not present

## 2016-07-06 DIAGNOSIS — M7631 Iliotibial band syndrome, right leg: Secondary | ICD-10-CM | POA: Insufficient documentation

## 2016-07-06 MED ORDER — TECHNETIUM TC 99M MEDRONATE IV KIT: 27.5000 | PACK | Freq: Once | INTRAVENOUS | Status: DC | PRN

## 2016-07-24 DIAGNOSIS — Z471 Aftercare following joint replacement surgery: Secondary | ICD-10-CM | POA: Diagnosis not present

## 2016-07-24 DIAGNOSIS — M25561 Pain in right knee: Secondary | ICD-10-CM | POA: Diagnosis not present

## 2016-07-24 DIAGNOSIS — Z96651 Presence of right artificial knee joint: Secondary | ICD-10-CM | POA: Diagnosis not present

## 2016-08-04 ENCOUNTER — Ambulatory Visit: Payer: Medicare Other | Admitting: Vascular Surgery

## 2016-08-11 DIAGNOSIS — N3090 Cystitis, unspecified without hematuria: Secondary | ICD-10-CM | POA: Diagnosis not present

## 2016-08-11 DIAGNOSIS — Z6832 Body mass index (BMI) 32.0-32.9, adult: Secondary | ICD-10-CM | POA: Diagnosis not present

## 2016-08-11 DIAGNOSIS — Z0000 Encounter for general adult medical examination without abnormal findings: Secondary | ICD-10-CM | POA: Diagnosis not present

## 2016-08-11 DIAGNOSIS — I10 Essential (primary) hypertension: Secondary | ICD-10-CM | POA: Diagnosis not present

## 2016-08-11 DIAGNOSIS — E782 Mixed hyperlipidemia: Secondary | ICD-10-CM | POA: Diagnosis not present

## 2016-10-14 DIAGNOSIS — Z23 Encounter for immunization: Secondary | ICD-10-CM | POA: Diagnosis not present

## 2016-10-16 ENCOUNTER — Other Ambulatory Visit: Payer: Self-pay | Admitting: Family Medicine

## 2016-10-16 DIAGNOSIS — Z1231 Encounter for screening mammogram for malignant neoplasm of breast: Secondary | ICD-10-CM

## 2016-11-05 ENCOUNTER — Ambulatory Visit
Admission: RE | Admit: 2016-11-05 | Discharge: 2016-11-05 | Disposition: A | Discharge status: Home / Self Care | Payer: Medicare Other | Source: Ambulatory Visit | Attending: Family Medicine | Admitting: Family Medicine

## 2016-11-05 DIAGNOSIS — Z1231 Encounter for screening mammogram for malignant neoplasm of breast: Secondary | ICD-10-CM

## 2016-12-29 DIAGNOSIS — H8110 Benign paroxysmal vertigo, unspecified ear: Secondary | ICD-10-CM | POA: Diagnosis not present

## 2017-01-18 DIAGNOSIS — R42 Dizziness and giddiness: Secondary | ICD-10-CM | POA: Diagnosis not present

## 2017-01-18 DIAGNOSIS — J343 Hypertrophy of nasal turbinates: Secondary | ICD-10-CM | POA: Diagnosis not present

## 2017-01-19 ENCOUNTER — Other Ambulatory Visit: Payer: Self-pay | Admitting: Otolaryngology

## 2017-01-19 DIAGNOSIS — IMO0002 Reserved for concepts with insufficient information to code with codable children: Secondary | ICD-10-CM

## 2017-01-20 ENCOUNTER — Ambulatory Visit
Admission: RE | Admit: 2017-01-20 | Discharge: 2017-01-20 | Disposition: A | Discharge status: Home / Self Care | Payer: Medicare Other | Source: Ambulatory Visit | Attending: Otolaryngology | Admitting: Otolaryngology

## 2017-01-20 DIAGNOSIS — IMO0002 Reserved for concepts with insufficient information to code with codable children: Secondary | ICD-10-CM

## 2017-01-20 DIAGNOSIS — R42 Dizziness and giddiness: Secondary | ICD-10-CM | POA: Diagnosis not present

## 2017-01-20 MED ORDER — GADOBENATE DIMEGLUMINE 529 MG/ML IV SOLN
16.0000 mL | Freq: Once | INTRAVENOUS | Status: AC | PRN
  Administered 2017-01-20: 16 mL via INTRAVENOUS

## 2017-06-07 DIAGNOSIS — Z961 Presence of intraocular lens: Secondary | ICD-10-CM | POA: Diagnosis not present

## 2017-06-07 DIAGNOSIS — H353131 Nonexudative age-related macular degeneration, bilateral, early dry stage: Secondary | ICD-10-CM | POA: Diagnosis not present

## 2017-06-07 DIAGNOSIS — H43393 Other vitreous opacities, bilateral: Secondary | ICD-10-CM | POA: Diagnosis not present

## 2017-06-07 DIAGNOSIS — H179 Unspecified corneal scar and opacity: Secondary | ICD-10-CM | POA: Diagnosis not present

## 2017-07-05 DIAGNOSIS — S335XXA Sprain of ligaments of lumbar spine, initial encounter: Secondary | ICD-10-CM | POA: Diagnosis not present

## 2017-07-05 DIAGNOSIS — M5137 Other intervertebral disc degeneration, lumbosacral region: Secondary | ICD-10-CM | POA: Diagnosis not present

## 2017-07-05 DIAGNOSIS — M545 Low back pain: Secondary | ICD-10-CM | POA: Diagnosis not present

## 2017-10-15 DIAGNOSIS — M549 Dorsalgia, unspecified: Secondary | ICD-10-CM | POA: Diagnosis not present

## 2017-10-15 DIAGNOSIS — Z23 Encounter for immunization: Secondary | ICD-10-CM | POA: Diagnosis not present

## 2017-11-02 DIAGNOSIS — M50322 Other cervical disc degeneration at C5-C6 level: Secondary | ICD-10-CM | POA: Diagnosis not present

## 2017-11-02 DIAGNOSIS — M9903 Segmental and somatic dysfunction of lumbar region: Secondary | ICD-10-CM | POA: Diagnosis not present

## 2017-11-02 DIAGNOSIS — M5136 Other intervertebral disc degeneration, lumbar region: Secondary | ICD-10-CM | POA: Diagnosis not present

## 2017-11-02 DIAGNOSIS — M9901 Segmental and somatic dysfunction of cervical region: Secondary | ICD-10-CM | POA: Diagnosis not present

## 2017-11-04 DIAGNOSIS — M9901 Segmental and somatic dysfunction of cervical region: Secondary | ICD-10-CM | POA: Diagnosis not present

## 2017-11-04 DIAGNOSIS — M9903 Segmental and somatic dysfunction of lumbar region: Secondary | ICD-10-CM | POA: Diagnosis not present

## 2017-11-04 DIAGNOSIS — M50322 Other cervical disc degeneration at C5-C6 level: Secondary | ICD-10-CM | POA: Diagnosis not present

## 2017-11-04 DIAGNOSIS — M5136 Other intervertebral disc degeneration, lumbar region: Secondary | ICD-10-CM | POA: Diagnosis not present

## 2017-11-11 DIAGNOSIS — M9901 Segmental and somatic dysfunction of cervical region: Secondary | ICD-10-CM | POA: Diagnosis not present

## 2017-11-11 DIAGNOSIS — M50322 Other cervical disc degeneration at C5-C6 level: Secondary | ICD-10-CM | POA: Diagnosis not present

## 2017-11-11 DIAGNOSIS — M5136 Other intervertebral disc degeneration, lumbar region: Secondary | ICD-10-CM | POA: Diagnosis not present

## 2017-11-11 DIAGNOSIS — M9903 Segmental and somatic dysfunction of lumbar region: Secondary | ICD-10-CM | POA: Diagnosis not present

## 2017-11-16 DIAGNOSIS — E782 Mixed hyperlipidemia: Secondary | ICD-10-CM | POA: Diagnosis not present

## 2017-11-16 DIAGNOSIS — I10 Essential (primary) hypertension: Secondary | ICD-10-CM | POA: Diagnosis not present

## 2017-11-16 DIAGNOSIS — Z0000 Encounter for general adult medical examination without abnormal findings: Secondary | ICD-10-CM | POA: Diagnosis not present

## 2018-01-19 ENCOUNTER — Other Ambulatory Visit: Payer: Self-pay | Admitting: Family Medicine

## 2018-01-19 DIAGNOSIS — M9904 Segmental and somatic dysfunction of sacral region: Secondary | ICD-10-CM | POA: Diagnosis not present

## 2018-01-19 DIAGNOSIS — Z139 Encounter for screening, unspecified: Secondary | ICD-10-CM

## 2018-01-19 DIAGNOSIS — M5136 Other intervertebral disc degeneration, lumbar region: Secondary | ICD-10-CM | POA: Diagnosis not present

## 2018-01-19 DIAGNOSIS — M9905 Segmental and somatic dysfunction of pelvic region: Secondary | ICD-10-CM | POA: Diagnosis not present

## 2018-01-19 DIAGNOSIS — M9903 Segmental and somatic dysfunction of lumbar region: Secondary | ICD-10-CM | POA: Diagnosis not present

## 2018-01-20 ENCOUNTER — Ambulatory Visit
Admission: RE | Admit: 2018-01-20 | Discharge: 2018-01-20 | Disposition: A | Discharge status: Home / Self Care | Payer: Medicare Other | Source: Ambulatory Visit | Attending: Family Medicine | Admitting: Family Medicine

## 2018-01-20 DIAGNOSIS — Z1231 Encounter for screening mammogram for malignant neoplasm of breast: Secondary | ICD-10-CM | POA: Diagnosis not present

## 2018-01-20 DIAGNOSIS — Z139 Encounter for screening, unspecified: Secondary | ICD-10-CM

## 2018-01-27 DIAGNOSIS — M5136 Other intervertebral disc degeneration, lumbar region: Secondary | ICD-10-CM | POA: Diagnosis not present

## 2018-01-27 DIAGNOSIS — M9904 Segmental and somatic dysfunction of sacral region: Secondary | ICD-10-CM | POA: Diagnosis not present

## 2018-01-27 DIAGNOSIS — M9905 Segmental and somatic dysfunction of pelvic region: Secondary | ICD-10-CM | POA: Diagnosis not present

## 2018-01-27 DIAGNOSIS — M9903 Segmental and somatic dysfunction of lumbar region: Secondary | ICD-10-CM | POA: Diagnosis not present

## 2018-02-14 DIAGNOSIS — M5136 Other intervertebral disc degeneration, lumbar region: Secondary | ICD-10-CM | POA: Diagnosis not present

## 2018-02-14 DIAGNOSIS — M9904 Segmental and somatic dysfunction of sacral region: Secondary | ICD-10-CM | POA: Diagnosis not present

## 2018-02-14 DIAGNOSIS — M9903 Segmental and somatic dysfunction of lumbar region: Secondary | ICD-10-CM | POA: Diagnosis not present

## 2018-02-14 DIAGNOSIS — M9905 Segmental and somatic dysfunction of pelvic region: Secondary | ICD-10-CM | POA: Diagnosis not present

## 2018-02-16 DIAGNOSIS — M9904 Segmental and somatic dysfunction of sacral region: Secondary | ICD-10-CM | POA: Diagnosis not present

## 2018-02-16 DIAGNOSIS — M9903 Segmental and somatic dysfunction of lumbar region: Secondary | ICD-10-CM | POA: Diagnosis not present

## 2018-02-16 DIAGNOSIS — M5136 Other intervertebral disc degeneration, lumbar region: Secondary | ICD-10-CM | POA: Diagnosis not present

## 2018-02-16 DIAGNOSIS — M9905 Segmental and somatic dysfunction of pelvic region: Secondary | ICD-10-CM | POA: Diagnosis not present

## 2018-03-21 DIAGNOSIS — M9904 Segmental and somatic dysfunction of sacral region: Secondary | ICD-10-CM | POA: Diagnosis not present

## 2018-03-21 DIAGNOSIS — M9905 Segmental and somatic dysfunction of pelvic region: Secondary | ICD-10-CM | POA: Diagnosis not present

## 2018-03-21 DIAGNOSIS — M5136 Other intervertebral disc degeneration, lumbar region: Secondary | ICD-10-CM | POA: Diagnosis not present

## 2018-03-21 DIAGNOSIS — M979XXD Periprosthetic fracture around unspecified internal prosthetic joint, subsequent encounter: Secondary | ICD-10-CM | POA: Diagnosis not present

## 2018-03-23 DIAGNOSIS — M9903 Segmental and somatic dysfunction of lumbar region: Secondary | ICD-10-CM | POA: Diagnosis not present

## 2018-03-23 DIAGNOSIS — M9904 Segmental and somatic dysfunction of sacral region: Secondary | ICD-10-CM | POA: Diagnosis not present

## 2018-03-23 DIAGNOSIS — M5136 Other intervertebral disc degeneration, lumbar region: Secondary | ICD-10-CM | POA: Diagnosis not present

## 2018-03-23 DIAGNOSIS — M9905 Segmental and somatic dysfunction of pelvic region: Secondary | ICD-10-CM | POA: Diagnosis not present

## 2018-03-24 DIAGNOSIS — M9904 Segmental and somatic dysfunction of sacral region: Secondary | ICD-10-CM | POA: Diagnosis not present

## 2018-03-24 DIAGNOSIS — M9903 Segmental and somatic dysfunction of lumbar region: Secondary | ICD-10-CM | POA: Diagnosis not present

## 2018-03-24 DIAGNOSIS — M5136 Other intervertebral disc degeneration, lumbar region: Secondary | ICD-10-CM | POA: Diagnosis not present

## 2018-03-24 DIAGNOSIS — M9905 Segmental and somatic dysfunction of pelvic region: Secondary | ICD-10-CM | POA: Diagnosis not present

## 2018-04-12 DIAGNOSIS — Z6833 Body mass index (BMI) 33.0-33.9, adult: Secondary | ICD-10-CM | POA: Diagnosis not present

## 2018-04-12 DIAGNOSIS — M25532 Pain in left wrist: Secondary | ICD-10-CM | POA: Diagnosis not present

## 2018-06-09 DIAGNOSIS — H43393 Other vitreous opacities, bilateral: Secondary | ICD-10-CM | POA: Diagnosis not present

## 2018-06-09 DIAGNOSIS — H04123 Dry eye syndrome of bilateral lacrimal glands: Secondary | ICD-10-CM | POA: Diagnosis not present

## 2018-06-09 DIAGNOSIS — Z961 Presence of intraocular lens: Secondary | ICD-10-CM | POA: Diagnosis not present

## 2018-06-09 DIAGNOSIS — H353131 Nonexudative age-related macular degeneration, bilateral, early dry stage: Secondary | ICD-10-CM | POA: Diagnosis not present

## 2018-10-24 DIAGNOSIS — Z23 Encounter for immunization: Secondary | ICD-10-CM | POA: Diagnosis not present

## 2018-12-16 DIAGNOSIS — I10 Essential (primary) hypertension: Secondary | ICD-10-CM | POA: Diagnosis not present

## 2018-12-16 DIAGNOSIS — E782 Mixed hyperlipidemia: Secondary | ICD-10-CM | POA: Diagnosis not present

## 2018-12-30 ENCOUNTER — Other Ambulatory Visit: Payer: Self-pay | Admitting: Family Medicine

## 2018-12-30 DIAGNOSIS — I10 Essential (primary) hypertension: Secondary | ICD-10-CM | POA: Diagnosis not present

## 2018-12-30 DIAGNOSIS — Z1389 Encounter for screening for other disorder: Secondary | ICD-10-CM | POA: Diagnosis not present

## 2018-12-30 DIAGNOSIS — M8588 Other specified disorders of bone density and structure, other site: Secondary | ICD-10-CM

## 2018-12-30 DIAGNOSIS — Z23 Encounter for immunization: Secondary | ICD-10-CM | POA: Diagnosis not present

## 2018-12-30 DIAGNOSIS — E669 Obesity, unspecified: Secondary | ICD-10-CM | POA: Diagnosis not present

## 2018-12-30 DIAGNOSIS — E782 Mixed hyperlipidemia: Secondary | ICD-10-CM | POA: Diagnosis not present

## 2018-12-30 DIAGNOSIS — Z0000 Encounter for general adult medical examination without abnormal findings: Secondary | ICD-10-CM | POA: Diagnosis not present

## 2018-12-30 DIAGNOSIS — R0981 Nasal congestion: Secondary | ICD-10-CM | POA: Diagnosis not present

## 2018-12-30 DIAGNOSIS — K5900 Constipation, unspecified: Secondary | ICD-10-CM | POA: Diagnosis not present

## 2018-12-30 DIAGNOSIS — Z1231 Encounter for screening mammogram for malignant neoplasm of breast: Secondary | ICD-10-CM

## 2019-02-02 DIAGNOSIS — L570 Actinic keratosis: Secondary | ICD-10-CM | POA: Diagnosis not present

## 2019-02-02 DIAGNOSIS — L710 Perioral dermatitis: Secondary | ICD-10-CM | POA: Diagnosis not present

## 2019-02-02 DIAGNOSIS — L814 Other melanin hyperpigmentation: Secondary | ICD-10-CM | POA: Diagnosis not present

## 2019-02-02 DIAGNOSIS — D485 Neoplasm of uncertain behavior of skin: Secondary | ICD-10-CM | POA: Diagnosis not present

## 2019-02-09 DIAGNOSIS — H5711 Ocular pain, right eye: Secondary | ICD-10-CM | POA: Diagnosis not present

## 2019-02-09 DIAGNOSIS — H04123 Dry eye syndrome of bilateral lacrimal glands: Secondary | ICD-10-CM | POA: Diagnosis not present

## 2019-02-24 ENCOUNTER — Ambulatory Visit
Admission: RE | Admit: 2019-02-24 | Discharge: 2019-02-24 | Disposition: A | Discharge status: Home / Self Care | Payer: Medicare Other | Source: Ambulatory Visit | Attending: Family Medicine | Admitting: Family Medicine

## 2019-02-24 DIAGNOSIS — Z1231 Encounter for screening mammogram for malignant neoplasm of breast: Secondary | ICD-10-CM

## 2019-02-24 DIAGNOSIS — M8588 Other specified disorders of bone density and structure, other site: Secondary | ICD-10-CM

## 2019-02-24 DIAGNOSIS — Z780 Asymptomatic menopausal state: Secondary | ICD-10-CM | POA: Diagnosis not present

## 2019-02-24 DIAGNOSIS — M85852 Other specified disorders of bone density and structure, left thigh: Secondary | ICD-10-CM | POA: Diagnosis not present

## 2019-04-10 DIAGNOSIS — H04123 Dry eye syndrome of bilateral lacrimal glands: Secondary | ICD-10-CM | POA: Diagnosis not present

## 2019-04-10 DIAGNOSIS — H1859 Other hereditary corneal dystrophies: Secondary | ICD-10-CM | POA: Diagnosis not present

## 2019-04-10 DIAGNOSIS — H11441 Conjunctival cysts, right eye: Secondary | ICD-10-CM | POA: Diagnosis not present

## 2019-04-10 DIAGNOSIS — H5711 Ocular pain, right eye: Secondary | ICD-10-CM | POA: Diagnosis not present

## 2019-09-19 DIAGNOSIS — Z23 Encounter for immunization: Secondary | ICD-10-CM | POA: Diagnosis not present

## 2020-01-08 DIAGNOSIS — E782 Mixed hyperlipidemia: Secondary | ICD-10-CM | POA: Diagnosis not present

## 2020-01-08 DIAGNOSIS — R0981 Nasal congestion: Secondary | ICD-10-CM | POA: Diagnosis not present

## 2020-01-08 DIAGNOSIS — Z0000 Encounter for general adult medical examination without abnormal findings: Secondary | ICD-10-CM | POA: Diagnosis not present

## 2020-01-08 DIAGNOSIS — I10 Essential (primary) hypertension: Secondary | ICD-10-CM | POA: Diagnosis not present

## 2020-04-03 DIAGNOSIS — L720 Epidermal cyst: Secondary | ICD-10-CM | POA: Diagnosis not present

## 2020-04-03 DIAGNOSIS — D1801 Hemangioma of skin and subcutaneous tissue: Secondary | ICD-10-CM | POA: Diagnosis not present

## 2020-04-03 DIAGNOSIS — L918 Other hypertrophic disorders of the skin: Secondary | ICD-10-CM | POA: Diagnosis not present

## 2020-04-03 DIAGNOSIS — L814 Other melanin hyperpigmentation: Secondary | ICD-10-CM | POA: Diagnosis not present

## 2020-04-03 DIAGNOSIS — L821 Other seborrheic keratosis: Secondary | ICD-10-CM | POA: Diagnosis not present

## 2020-04-03 DIAGNOSIS — D2261 Melanocytic nevi of right upper limb, including shoulder: Secondary | ICD-10-CM | POA: Diagnosis not present

## 2020-04-22 ENCOUNTER — Other Ambulatory Visit: Payer: Self-pay | Admitting: Family Medicine

## 2020-04-22 DIAGNOSIS — Z1231 Encounter for screening mammogram for malignant neoplasm of breast: Secondary | ICD-10-CM

## 2020-04-23 ENCOUNTER — Ambulatory Visit
Admission: RE | Admit: 2020-04-23 | Discharge: 2020-04-23 | Disposition: A | Discharge status: Home / Self Care | Payer: Medicare Other | Source: Ambulatory Visit | Attending: Family Medicine | Admitting: Family Medicine

## 2020-04-23 ENCOUNTER — Other Ambulatory Visit: Payer: Self-pay

## 2020-04-23 DIAGNOSIS — Z1231 Encounter for screening mammogram for malignant neoplasm of breast: Secondary | ICD-10-CM | POA: Diagnosis not present

## 2020-04-24 DIAGNOSIS — H04123 Dry eye syndrome of bilateral lacrimal glands: Secondary | ICD-10-CM | POA: Diagnosis not present

## 2020-04-24 DIAGNOSIS — H5711 Ocular pain, right eye: Secondary | ICD-10-CM | POA: Diagnosis not present

## 2020-05-10 DIAGNOSIS — H5711 Ocular pain, right eye: Secondary | ICD-10-CM | POA: Diagnosis not present

## 2020-05-10 DIAGNOSIS — H04123 Dry eye syndrome of bilateral lacrimal glands: Secondary | ICD-10-CM | POA: Diagnosis not present

## 2020-06-03 DIAGNOSIS — M9904 Segmental and somatic dysfunction of sacral region: Secondary | ICD-10-CM | POA: Diagnosis not present

## 2020-06-03 DIAGNOSIS — M5136 Other intervertebral disc degeneration, lumbar region: Secondary | ICD-10-CM | POA: Diagnosis not present

## 2020-06-03 DIAGNOSIS — M9903 Segmental and somatic dysfunction of lumbar region: Secondary | ICD-10-CM | POA: Diagnosis not present

## 2020-06-03 DIAGNOSIS — M9905 Segmental and somatic dysfunction of pelvic region: Secondary | ICD-10-CM | POA: Diagnosis not present

## 2020-06-04 DIAGNOSIS — M9904 Segmental and somatic dysfunction of sacral region: Secondary | ICD-10-CM | POA: Diagnosis not present

## 2020-06-04 DIAGNOSIS — M9905 Segmental and somatic dysfunction of pelvic region: Secondary | ICD-10-CM | POA: Diagnosis not present

## 2020-06-04 DIAGNOSIS — M5136 Other intervertebral disc degeneration, lumbar region: Secondary | ICD-10-CM | POA: Diagnosis not present

## 2020-06-04 DIAGNOSIS — M9903 Segmental and somatic dysfunction of lumbar region: Secondary | ICD-10-CM | POA: Diagnosis not present

## 2020-06-06 DIAGNOSIS — M9905 Segmental and somatic dysfunction of pelvic region: Secondary | ICD-10-CM | POA: Diagnosis not present

## 2020-06-06 DIAGNOSIS — M9903 Segmental and somatic dysfunction of lumbar region: Secondary | ICD-10-CM | POA: Diagnosis not present

## 2020-06-06 DIAGNOSIS — M5136 Other intervertebral disc degeneration, lumbar region: Secondary | ICD-10-CM | POA: Diagnosis not present

## 2020-06-06 DIAGNOSIS — M9904 Segmental and somatic dysfunction of sacral region: Secondary | ICD-10-CM | POA: Diagnosis not present

## 2020-07-02 ENCOUNTER — Other Ambulatory Visit: Payer: Self-pay

## 2020-07-02 ENCOUNTER — Ambulatory Visit (INDEPENDENT_AMBULATORY_CARE_PROVIDER_SITE_OTHER): Payer: Medicare Other | Admitting: Otolaryngology

## 2020-07-02 VITALS — Temp 97.0°F

## 2020-07-02 DIAGNOSIS — H6123 Impacted cerumen, bilateral: Secondary | ICD-10-CM

## 2020-07-02 DIAGNOSIS — J310 Chronic rhinitis: Secondary | ICD-10-CM

## 2020-07-02 NOTE — Progress Notes (Signed)
HPI: Beverly Moreno is a 79 y.o. female who presents for evaluation of hearing loss in the right ear that was bad 2 weeks ago.  However she saw her PCP and this was partially cleaned but she still wanted it checked again.  She also complains of postnasal drainage.  She has been using Flonase which helps some she complains of a drip from the pack the nose down her throat.  Past Medical History:  Diagnosis Date  . Arthritis   . GERD (gastroesophageal reflux disease)   . Heart murmur, systolic   . Hyperlipidemia   . Hypertension   . Obesity   . Varicose veins    Past Surgical History:  Procedure Laterality Date  . ABDOMINAL HYSTERECTOMY    . APPENDECTOMY     age 42  . ROTATOR CUFF REPAIR     right   . TONSILLECTOMY     as child  . TOTAL KNEE ARTHROPLASTY Right 04/09/2014   Procedure: RIGHT TOTAL KNEE ARTHROPLASTY;  Surgeon: Gearlean Alf, MD;  Location: [facility];  Service: Orthopedics;  Laterality: Right;   Social History   Socioeconomic History  . Marital status: Married    Spouse name: Not on file  . Number of children: Not on file  . Years of education: Not on file  . Highest education level: Not on file  Occupational History  . Not on file  Tobacco Use  . Smoking status: Former Smoker    Quit date: 02/06/1979    Years since quitting: 41.4  . Smokeless tobacco: Never Used  Substance and Sexual Activity  . Alcohol use: Yes    Alcohol/week: 0.0 standard drinks    Comment: wine weekly  . Drug use: No  . Sexual activity: Not on file  Other Topics Concern  . Not on file  Social History Narrative  . Not on file   Social Determinants of Health   Financial Resource Strain:   . Difficulty of Paying Living Expenses:   Food Insecurity:   . Worried About Charity fundraiser in the Last Year:   . Arboriculturist in the Last Year:   Transportation Needs:   . Film/video editor (Medical):   Marland Kitchen Lack of Transportation (Non-Medical):   Physical Activity:   . Days of  Exercise per Week:   . Minutes of Exercise per Session:   Stress:   . Feeling of Stress :   Social Connections:   . Frequency of Communication with Friends and Family:   . Frequency of Social Gatherings with Friends and Family:   . Attends Religious Services:   . Active Member of Clubs or Organizations:   . Attends Archivist Meetings:   Marland Kitchen Marital Status:    Family History  Problem Relation Age of Onset  . Heart attack Mother   . Parkinsonism Mother   . Alzheimer's disease Mother   . Aneurysm Father   . Hypertension Father    No Known Allergies Prior to Admission medications   Medication Sig Start Date End Date Taking? Authorizing Provider  aspirin 81 MG tablet Take 81 mg by mouth daily.   Yes [provider]  atorvastatin (LIPITOR) 20 MG tablet Take 20 mg by mouth daily.   Yes [provider]  calcium carbonate 1250 MG capsule Take 1,250 mg by mouth daily.   Yes [provider]  Cholecalciferol (VITAMIN D3) 2000 units TABS Take by mouth daily.   Yes [provider]  ibuprofen (  ADVIL,MOTRIN) 200 MG tablet Take 200 mg by mouth every 6 (six) hours as needed.   Yes [provider]  methocarbamol (ROBAXIN) 500 MG tablet Take 1 tablet (500 mg total) by mouth every 6 (six) hours as needed for muscle spasms. 04/11/14  Yes Perkins, Alexzandrew L, PA-C  Multiple Vitamin (MULTIVITAMIN) tablet Take 1 tablet by mouth daily. Reported on 03/31/2016   Yes [provider]  olmesartan-hydrochlorothiazide (BENICAR HCT) 20-12.5 MG per tablet Take 1 tablet by mouth every morning.   Yes [provider]  Omega-3 Fatty Acids (FISH OIL) 1000 MG CAPS Take by mouth daily. Reported on 03/31/2016   Yes [provider]  oxyCODONE (OXY IR/ROXICODONE) 5 MG immediate release tablet Take 1-2 tablets (5-10 mg total) by mouth every 3 (three) hours as needed for moderate pain, severe pain or breakthrough pain. 04/11/14  Yes Perkins, Alexzandrew  L, PA-C  rivaroxaban (XARELTO) 10 MG TABS tablet Take 1 tablet (10 mg total) by mouth daily with breakfast. Take Xarelto for two and a half more weeks, then discontinue Xarelto. Once the patient has completed the Xarelto, they may resume the 81 mg Aspirin. 04/11/14  Yes Perkins, Alexzandrew L, PA-C  Spirulina 500 MG TABS Take by mouth daily. Reported on 03/31/2016   Yes [provider]  traMADol (ULTRAM) 50 MG tablet Take 1-2 tablets (50-100 mg total) by mouth every 6 (six) hours as needed (mild to moderate pain). 04/11/14  Yes Perkins, Alexzandrew L, PA-C     Positive ROS: Otherwise negative  All other systems have been reviewed and were otherwise negative with the exception of those mentioned in the HPI and as above.  Physical Exam: Constitutional: Alert, well-appearing, no acute distress Ears: External ears without lesions or tenderness.  She still has moderate wax buildup in the right ear canal that was cleaned with suction.  The right TM was otherwise clear.  Left ear canal had minimal cerumen that was cleaned with a curette.  Left TM was clear. Nasal: External nose without lesions. Septum with minimal deformity.  Middle meatus was clear bilaterally.  Mild rhinitis with clear mucus discharge..  No signs of infection. Oral: Lips and gums without lesions. Tongue and palate mucosa without lesions. Posterior oropharynx clear. Neck: No palpable adenopathy or masses Respiratory: Breathing comfortably  Skin: No facial/neck lesions or rash noted.  Cerumen impaction removal  Date/Time: 07/02/2020 6:53 PM Performed by: Rozetta Nunnery, MD Authorized by: Rozetta Nunnery, MD   Consent:    Consent obtained:  Verbal   Consent given by:  Patient   Risks discussed:  Pain and bleeding Procedure details:    Location:  L ear and R ear   Procedure type: curette and suction   Post-procedure details:    Inspection:  TM intact and canal normal   Hearing quality:  Improved   Patient  tolerance of procedure:  Tolerated well, no immediate complications Comments:     TMs are clear bilaterally.    Assessment: Wax buildup worse on the right side. Chronic rhinitis with postnasal drainage with clear nasal passages otherwise and no signs of infection.  Plan: Recommended regular use of Flonase 2 sprays each nostril at night.  Also suggested using saline rinse and try to hypertonic saline rinse. Can also use antihistamines and suggested trying Claritin Allegra or Zyrtec.  Radene Journey, MD

## 2020-08-05 DIAGNOSIS — M5136 Other intervertebral disc degeneration, lumbar region: Secondary | ICD-10-CM | POA: Diagnosis not present

## 2020-08-05 DIAGNOSIS — M9903 Segmental and somatic dysfunction of lumbar region: Secondary | ICD-10-CM | POA: Diagnosis not present

## 2020-08-05 DIAGNOSIS — M9905 Segmental and somatic dysfunction of pelvic region: Secondary | ICD-10-CM | POA: Diagnosis not present

## 2020-08-05 DIAGNOSIS — M9904 Segmental and somatic dysfunction of sacral region: Secondary | ICD-10-CM | POA: Diagnosis not present

## 2020-08-06 DIAGNOSIS — M5136 Other intervertebral disc degeneration, lumbar region: Secondary | ICD-10-CM | POA: Diagnosis not present

## 2020-08-06 DIAGNOSIS — M9904 Segmental and somatic dysfunction of sacral region: Secondary | ICD-10-CM | POA: Diagnosis not present

## 2020-08-06 DIAGNOSIS — M9903 Segmental and somatic dysfunction of lumbar region: Secondary | ICD-10-CM | POA: Diagnosis not present

## 2020-08-06 DIAGNOSIS — M9905 Segmental and somatic dysfunction of pelvic region: Secondary | ICD-10-CM | POA: Diagnosis not present

## 2020-08-07 DIAGNOSIS — M9905 Segmental and somatic dysfunction of pelvic region: Secondary | ICD-10-CM | POA: Diagnosis not present

## 2020-08-07 DIAGNOSIS — M9903 Segmental and somatic dysfunction of lumbar region: Secondary | ICD-10-CM | POA: Diagnosis not present

## 2020-08-07 DIAGNOSIS — M5136 Other intervertebral disc degeneration, lumbar region: Secondary | ICD-10-CM | POA: Diagnosis not present

## 2020-08-07 DIAGNOSIS — M9904 Segmental and somatic dysfunction of sacral region: Secondary | ICD-10-CM | POA: Diagnosis not present

## 2020-08-12 DIAGNOSIS — M5136 Other intervertebral disc degeneration, lumbar region: Secondary | ICD-10-CM | POA: Diagnosis not present

## 2020-08-12 DIAGNOSIS — M9904 Segmental and somatic dysfunction of sacral region: Secondary | ICD-10-CM | POA: Diagnosis not present

## 2020-08-12 DIAGNOSIS — M9905 Segmental and somatic dysfunction of pelvic region: Secondary | ICD-10-CM | POA: Diagnosis not present

## 2020-08-12 DIAGNOSIS — M9903 Segmental and somatic dysfunction of lumbar region: Secondary | ICD-10-CM | POA: Diagnosis not present

## 2020-09-03 DIAGNOSIS — H5711 Ocular pain, right eye: Secondary | ICD-10-CM | POA: Diagnosis not present

## 2020-09-03 DIAGNOSIS — H04123 Dry eye syndrome of bilateral lacrimal glands: Secondary | ICD-10-CM | POA: Diagnosis not present

## 2020-09-03 DIAGNOSIS — Z961 Presence of intraocular lens: Secondary | ICD-10-CM | POA: Diagnosis not present

## 2020-09-03 DIAGNOSIS — H353131 Nonexudative age-related macular degeneration, bilateral, early dry stage: Secondary | ICD-10-CM | POA: Diagnosis not present

## 2020-09-12 DIAGNOSIS — M9903 Segmental and somatic dysfunction of lumbar region: Secondary | ICD-10-CM | POA: Diagnosis not present

## 2020-09-12 DIAGNOSIS — M9904 Segmental and somatic dysfunction of sacral region: Secondary | ICD-10-CM | POA: Diagnosis not present

## 2020-09-12 DIAGNOSIS — M5136 Other intervertebral disc degeneration, lumbar region: Secondary | ICD-10-CM | POA: Diagnosis not present

## 2020-09-12 DIAGNOSIS — M9905 Segmental and somatic dysfunction of pelvic region: Secondary | ICD-10-CM | POA: Diagnosis not present

## 2020-09-29 DIAGNOSIS — Z23 Encounter for immunization: Secondary | ICD-10-CM | POA: Diagnosis not present

## 2020-10-08 DIAGNOSIS — M79675 Pain in left toe(s): Secondary | ICD-10-CM | POA: Diagnosis not present

## 2020-10-08 DIAGNOSIS — L600 Ingrowing nail: Secondary | ICD-10-CM | POA: Diagnosis not present

## 2020-10-10 DIAGNOSIS — M9903 Segmental and somatic dysfunction of lumbar region: Secondary | ICD-10-CM | POA: Diagnosis not present

## 2020-10-10 DIAGNOSIS — M5136 Other intervertebral disc degeneration, lumbar region: Secondary | ICD-10-CM | POA: Diagnosis not present

## 2020-10-10 DIAGNOSIS — M9904 Segmental and somatic dysfunction of sacral region: Secondary | ICD-10-CM | POA: Diagnosis not present

## 2020-10-10 DIAGNOSIS — M9905 Segmental and somatic dysfunction of pelvic region: Secondary | ICD-10-CM | POA: Diagnosis not present

## 2020-10-15 DIAGNOSIS — Z23 Encounter for immunization: Secondary | ICD-10-CM | POA: Diagnosis not present

## 2020-10-22 DIAGNOSIS — L600 Ingrowing nail: Secondary | ICD-10-CM | POA: Diagnosis not present

## 2020-11-11 DIAGNOSIS — E559 Vitamin D deficiency, unspecified: Secondary | ICD-10-CM | POA: Diagnosis not present

## 2020-11-11 DIAGNOSIS — M2550 Pain in unspecified joint: Secondary | ICD-10-CM | POA: Diagnosis not present

## 2020-11-11 DIAGNOSIS — H04129 Dry eye syndrome of unspecified lacrimal gland: Secondary | ICD-10-CM | POA: Diagnosis not present

## 2020-11-11 DIAGNOSIS — R5383 Other fatigue: Secondary | ICD-10-CM | POA: Diagnosis not present

## 2020-12-03 DIAGNOSIS — H04123 Dry eye syndrome of bilateral lacrimal glands: Secondary | ICD-10-CM | POA: Diagnosis not present

## 2020-12-03 DIAGNOSIS — H16223 Keratoconjunctivitis sicca, not specified as Sjogren's, bilateral: Secondary | ICD-10-CM | POA: Diagnosis not present

## 2020-12-03 DIAGNOSIS — H5711 Ocular pain, right eye: Secondary | ICD-10-CM | POA: Diagnosis not present

## 2021-01-21 DIAGNOSIS — Z0000 Encounter for general adult medical examination without abnormal findings: Secondary | ICD-10-CM | POA: Diagnosis not present

## 2021-01-21 DIAGNOSIS — Z1389 Encounter for screening for other disorder: Secondary | ICD-10-CM | POA: Diagnosis not present

## 2021-01-22 ENCOUNTER — Other Ambulatory Visit: Payer: Self-pay | Admitting: Family Medicine

## 2021-01-22 DIAGNOSIS — Z1231 Encounter for screening mammogram for malignant neoplasm of breast: Secondary | ICD-10-CM

## 2021-02-04 DIAGNOSIS — E669 Obesity, unspecified: Secondary | ICD-10-CM | POA: Diagnosis not present

## 2021-02-04 DIAGNOSIS — R5383 Other fatigue: Secondary | ICD-10-CM | POA: Diagnosis not present

## 2021-02-04 DIAGNOSIS — Z6836 Body mass index (BMI) 36.0-36.9, adult: Secondary | ICD-10-CM | POA: Diagnosis not present

## 2021-02-04 DIAGNOSIS — M3500 Sicca syndrome, unspecified: Secondary | ICD-10-CM | POA: Diagnosis not present

## 2021-02-04 DIAGNOSIS — M2550 Pain in unspecified joint: Secondary | ICD-10-CM | POA: Diagnosis not present

## 2021-02-04 DIAGNOSIS — M545 Low back pain, unspecified: Secondary | ICD-10-CM | POA: Diagnosis not present

## 2021-02-04 DIAGNOSIS — M150 Primary generalized (osteo)arthritis: Secondary | ICD-10-CM | POA: Diagnosis not present

## 2021-03-05 DIAGNOSIS — E669 Obesity, unspecified: Secondary | ICD-10-CM | POA: Diagnosis not present

## 2021-03-05 DIAGNOSIS — M545 Low back pain, unspecified: Secondary | ICD-10-CM | POA: Diagnosis not present

## 2021-03-05 DIAGNOSIS — Z6837 Body mass index (BMI) 37.0-37.9, adult: Secondary | ICD-10-CM | POA: Diagnosis not present

## 2021-03-05 DIAGNOSIS — M3500 Sicca syndrome, unspecified: Secondary | ICD-10-CM | POA: Diagnosis not present

## 2021-03-05 DIAGNOSIS — M150 Primary generalized (osteo)arthritis: Secondary | ICD-10-CM | POA: Diagnosis not present

## 2021-03-05 DIAGNOSIS — M2550 Pain in unspecified joint: Secondary | ICD-10-CM | POA: Diagnosis not present

## 2021-04-25 ENCOUNTER — Ambulatory Visit
Admission: RE | Admit: 2021-04-25 | Discharge: 2021-04-25 | Disposition: A | Discharge status: Home / Self Care | Payer: Medicare Other | Source: Ambulatory Visit | Attending: Family Medicine | Admitting: Family Medicine

## 2021-04-25 ENCOUNTER — Other Ambulatory Visit: Payer: Self-pay

## 2021-04-25 DIAGNOSIS — Z1231 Encounter for screening mammogram for malignant neoplasm of breast: Secondary | ICD-10-CM

## 2021-05-21 DIAGNOSIS — I10 Essential (primary) hypertension: Secondary | ICD-10-CM | POA: Diagnosis not present

## 2021-05-21 DIAGNOSIS — E559 Vitamin D deficiency, unspecified: Secondary | ICD-10-CM | POA: Diagnosis not present

## 2021-05-21 DIAGNOSIS — M1990 Unspecified osteoarthritis, unspecified site: Secondary | ICD-10-CM | POA: Diagnosis not present

## 2021-05-21 DIAGNOSIS — L659 Nonscarring hair loss, unspecified: Secondary | ICD-10-CM | POA: Diagnosis not present

## 2021-05-21 DIAGNOSIS — E782 Mixed hyperlipidemia: Secondary | ICD-10-CM | POA: Diagnosis not present

## 2021-05-21 DIAGNOSIS — R5383 Other fatigue: Secondary | ICD-10-CM | POA: Diagnosis not present

## 2021-09-02 DIAGNOSIS — H5711 Ocular pain, right eye: Secondary | ICD-10-CM | POA: Diagnosis not present

## 2021-09-02 DIAGNOSIS — H16223 Keratoconjunctivitis sicca, not specified as Sjogren's, bilateral: Secondary | ICD-10-CM | POA: Diagnosis not present

## 2021-09-02 DIAGNOSIS — H353131 Nonexudative age-related macular degeneration, bilateral, early dry stage: Secondary | ICD-10-CM | POA: Diagnosis not present

## 2021-09-02 DIAGNOSIS — H04123 Dry eye syndrome of bilateral lacrimal glands: Secondary | ICD-10-CM | POA: Diagnosis not present

## 2021-09-02 DIAGNOSIS — H04223 Epiphora due to insufficient drainage, bilateral lacrimal glands: Secondary | ICD-10-CM | POA: Diagnosis not present

## 2021-09-04 DIAGNOSIS — W19XXXA Unspecified fall, initial encounter: Secondary | ICD-10-CM | POA: Diagnosis not present

## 2021-09-04 DIAGNOSIS — S6991XA Unspecified injury of right wrist, hand and finger(s), initial encounter: Secondary | ICD-10-CM | POA: Diagnosis not present

## 2021-09-04 DIAGNOSIS — M25531 Pain in right wrist: Secondary | ICD-10-CM | POA: Diagnosis not present

## 2021-09-16 DIAGNOSIS — S52501A Unspecified fracture of the lower end of right radius, initial encounter for closed fracture: Secondary | ICD-10-CM | POA: Diagnosis not present

## 2021-09-16 DIAGNOSIS — M25531 Pain in right wrist: Secondary | ICD-10-CM | POA: Diagnosis not present

## 2021-09-19 DIAGNOSIS — Z23 Encounter for immunization: Secondary | ICD-10-CM | POA: Diagnosis not present

## 2021-10-13 DIAGNOSIS — Z23 Encounter for immunization: Secondary | ICD-10-CM | POA: Diagnosis not present

## 2022-01-22 DIAGNOSIS — Z0000 Encounter for general adult medical examination without abnormal findings: Secondary | ICD-10-CM | POA: Diagnosis not present

## 2022-02-13 DIAGNOSIS — H5711 Ocular pain, right eye: Secondary | ICD-10-CM | POA: Diagnosis not present

## 2022-02-13 DIAGNOSIS — L0390 Cellulitis, unspecified: Secondary | ICD-10-CM | POA: Diagnosis not present

## 2022-03-11 DIAGNOSIS — R300 Dysuria: Secondary | ICD-10-CM | POA: Diagnosis not present

## 2022-03-11 DIAGNOSIS — E669 Obesity, unspecified: Secondary | ICD-10-CM | POA: Diagnosis not present

## 2022-03-11 DIAGNOSIS — Z6835 Body mass index (BMI) 35.0-35.9, adult: Secondary | ICD-10-CM | POA: Diagnosis not present

## 2022-03-11 DIAGNOSIS — R3915 Urgency of urination: Secondary | ICD-10-CM | POA: Diagnosis not present

## 2022-05-28 ENCOUNTER — Telehealth: Payer: Self-pay

## 2022-05-28 NOTE — Telephone Encounter (Signed)
Called and LM for patient to call back to discuss records for upcoming appointment with Dr. Havery Moros on Wed, 6-7.  Need latest colonoscopy and EGD reports, with path if possible. Asked patient to call back to provide information and discuss

## 2022-06-03 ENCOUNTER — Encounter: Payer: Self-pay | Admitting: Gastroenterology

## 2022-06-03 ENCOUNTER — Ambulatory Visit (INDEPENDENT_AMBULATORY_CARE_PROVIDER_SITE_OTHER): Payer: Medicare Other | Admitting: Gastroenterology

## 2022-06-03 VITALS — BP 160/88 | HR 104 | Ht 62.5 in | Wt 200.5 lb

## 2022-06-03 DIAGNOSIS — K649 Unspecified hemorrhoids: Secondary | ICD-10-CM

## 2022-06-03 DIAGNOSIS — R140 Abdominal distension (gaseous): Secondary | ICD-10-CM | POA: Diagnosis not present

## 2022-06-03 DIAGNOSIS — K5900 Constipation, unspecified: Secondary | ICD-10-CM

## 2022-06-03 DIAGNOSIS — R1011 Right upper quadrant pain: Secondary | ICD-10-CM

## 2022-06-03 MED ORDER — CALMOL-4 76-10 % RE SUPP: RECTAL | 0 refills | Status: DC

## 2022-06-03 NOTE — Patient Instructions (Addendum)
If you are age 82 or older, your body mass index should be between 23-30. Your Body mass index is 36.09 kg/m. If this is out of the aforementioned range listed, please consider follow up with your Primary Care Provider.  If you are age 28 or younger, your body mass index should be between 19-25. Your Body mass index is 36.09 kg/m. If this is out of the aformentioned range listed, please consider follow up with your Primary Care Provider.   ________________________________________________________  We are referring you to [facility]. They will contact you directly to schedule an appointment.  It may take a week or more before you hear from them.  Please feel free to contact us if you have not heard from them within 2 weeks and we will follow up on the referral.   We will request your records from [place].  Please use Miralax once daily and titrate as needed.  Stop prune juice.   Please purchase the following medications over the counter and take as directed: Calmol 4 suppositories.  If you can't find these at your local pharmacy you can order them on [place].  We have scheduled you for a follow up appointment on Tuesday, 7-25 at 8:10 am.  Thank you for entrusting me with your care and for choosing [facility], Dr. Tripoli Cellar

## 2022-06-03 NOTE — Progress Notes (Signed)
HPI :  81 year old female with a history of reported CAD, GERD, hypertension, hyperlipidemia, obesity, here for new patient assessment for abdominal pain, constipation.  She reports having some pain in her right upper side for the past 2 years or so.  This has been more of a chronic type of discomfort she has had.  She feels some mild discomfort there all the time, never really goes away, but can get worse at times.  When she bends over to tie her shoes she feels that the pain can be worsened with that.  She also feels that when she is constipated and makes her side hurt more, and a bowel movement can make her feel better.  She denies any prandial component to her symptoms.  She denies any worsening of pain with eating.  No nausea or vomiting.  No reflux symptoms.  No dysphagia.  She does endorse having some chronic constipation.  She states about 80% of the time she will have a bowel movement once daily but has to strain at times and has hard pellets of stool.  She has some blood noted on the toilet paper when she wipes herself she thinks from hemorrhoids.  No blood in the toilet bowl or stool.  She has been taking half a cap of MiraLAX daily combined with eating prunes on a daily basis to help with her bowels although she is not quite satisfied with the regimen.  She denies any family history of colon cancer.  Her mother had diverticulitis.  She thinks your last colonoscopy was in 2007.  After the patient left the office her records arrived.  She had a colonoscopy by Earle Gell in February 2007 and the exam was normal with excellent prep.  That was done at the age of 9.  She has had a knee replacement and takes ibuprofen as needed for arthritic pain.  Perhaps she will take 1 dose a few times a week for pain but does not take it daily.  Otherwise we had requested some records from her primary care and [facility], have not arrived yet.  No labs on file for her that we can look at.  She does not  think she has had any abdominal imaging recently.   Colonoscopy - February 2007 - Dr. Earle Gell -excellent prep, normal  Past Medical History:  Diagnosis Date   Arthritis    CAD (coronary artery disease)    GERD (gastroesophageal reflux disease)    Heart murmur, systolic    Hyperlipidemia    Hypertension    Obesity    Osteoarthritis    Osteopenia    Sjogren syndrome, unspecified ([place])    Varicose veins      Past Surgical History:  Procedure Laterality Date   ABDOMINAL HYSTERECTOMY     APPENDECTOMY     age 70   ROTATOR CUFF REPAIR Right    TONSILLECTOMY     as child   TOTAL KNEE ARTHROPLASTY Right 04/09/2014   Procedure: RIGHT TOTAL KNEE ARTHROPLASTY;  Surgeon: Gearlean Alf, MD;  Location: [facility];  Service: Orthopedics;  Laterality: Right;   Family History  Problem Relation Age of Onset   Heart disease Mother    Heart attack Mother    Anuerysm Father        Brain   Hypertension Father    Gallstones Father    Kidney Stones Sister    Breast cancer Maternal Grandmother    Lung cancer Maternal Grandfather    Social  History   Tobacco Use   Smoking status: Former    Types: Cigarettes    Quit date: 02/06/1979    Years since quitting: 43.3   Smokeless tobacco: Never  Vaping Use   Vaping Use: Never used  Substance Use Topics   Alcohol use: Yes    Alcohol/week: 0.0 standard drinks    Comment: wine weekly   Drug use: No   Current Outpatient Medications  Medication Sig Dispense Refill   aspirin 81 MG tablet Take 81 mg by mouth daily.     atorvastatin (LIPITOR) 20 MG tablet Take 20 mg by mouth daily.     Cholecalciferol (VITAMIN D3) 2000 units TABS Take by mouth daily.     Glycerin-Polysorbate 80 (REFRESH DRY EYE THERAPY OP) Place 3 drops into both eyes daily.     ibuprofen (ADVIL,MOTRIN) 200 MG tablet Take 200 mg by mouth every 6 (six) hours as needed.     MAGNESIUM GLYCINATE PO Take 400 mg by mouth daily.     Multiple Vitamin (MULTIVITAMIN) tablet Take 1  tablet by mouth daily. Nutrafol     Multiple Vitamins-Minerals (OCUVITE EYE HEALTH FORMULA) CAPS Take 1 tablet by mouth daily.     olmesartan-hydrochlorothiazide (BENICAR HCT) 40-12.5 MG tablet Take 1 tablet by mouth daily.     polyethylene glycol powder (GLYCOLAX/MIRALAX) 17 GM/SCOOP powder Take 17 g by mouth daily.     vitamin C (ASCORBIC ACID) 500 MG tablet Take 500 mg by mouth daily.     No current facility-administered medications for this visit.   Allergies  Allergen Reactions   Celebrex [Celecoxib]      Review of Systems: All systems reviewed and negative except where noted in HPI.    No recent labs on file  Physical Exam: BP (!) 160/88 (BP Location: Left Arm, Patient Position: Sitting, Cuff Size: Normal)   Pulse (!) 104   Ht 5' 2.5" (1.588 m) Comment: height measured without shoes  Wt 200 lb 8 oz (90.9 kg)   BMI 36.09 kg/m  Constitutional: Pleasant,well-developed, female in no acute distress. HEENT: Normocephalic and atraumatic. Conjunctivae are normal. No scleral icterus. Neck supple.  Cardiovascular: Normal rate, regular rhythm.  Pulmonary/chest: Effort normal and breath sounds normal. No wheezing, rales or rhonchi. Abdominal: Soft, nondistended, tenderness to palpation with positive Carnett sign along right costal margin. There are no masses palpable. No hepatomegaly. DRE / anoscopy - [facility] Tia Alert as standby - no mass lesions, internal hemorrhoids, normal resting tone but paradoxical squeeze with decent Extremities: no edema Lymphadenopathy: No cervical adenopathy noted. Neurological: Alert and oriented to person place and time. Skin: Skin is warm and dry. No rashes noted. Psychiatric: Normal mood and affect. Behavior is normal.   ASSESSMENT AND PLAN: 81 year old female here for new patient assessment of the following:   Constipation Bloating Right upper quadrant pain Hemorrhoids  As above, chronic constipation that does not appear to be as well managed as  it could be.  We discussed options.  She appears to have some component of pelvic floor dyssynergia on DRE today.  I will refer her to pelvic floor PT to see if that will help at all.  Otherwise will also treat component of slow transit, recommend she increase MiraLAX to 1 full dose per day, and can increase to twice daily as needed.  Titrate up or down to effect for 1 soft stool daily.  She can stop the prunes if that causes too much urgency.  We got the report of her  last colonoscopy which was normal at age 22, though she stopped further screening.  I have requested labs and any imaging done recently from her primary care and will review that.  I would like to treat her constipation first and see how she feels.  Her right upper quadrant pain is not consistent with biliary colic, no prandial association.  She very well may have some costochondritis there or abdominal wall pain based on description and exam findings, although does have some relief with a bowel movement.  I think she is having some scant bleeding related to internal hemorrhoids.  We will make sure no anemia.  She can try some Calmol 4 suppositories as needed to see if that helps.  I am going to see her back in the office in 6 weeks for reassessment.  If her symptoms resolve and she is feeling better then we will continue to monitor.  If she has persistent symptoms that bother her, pending findings on any recent imaging or labs, we may consider either a colonoscopy or imaging.  We will await her course.  She agrees  Plan: - obtain records from PCP - any recent imaging or labs - increase Miralax to one full dose every day and titrate up or down as needed - stop prune juice - refer to pelvic floor PT - trial of Calmol4 suppositories PRN - reassured her RUQ pain would be atypical for biliary colic, suspect this could be musculoskeletal - f/u in 6 weeks as above  Jolly Mango, MD [facility]  CC: Lawerance Cruel,  MD

## 2022-07-06 ENCOUNTER — Ambulatory Visit: Payer: Medicare Other | Attending: Gastroenterology | Admitting: Physical Therapy

## 2022-07-06 ENCOUNTER — Encounter: Payer: Self-pay | Admitting: Physical Therapy

## 2022-07-06 ENCOUNTER — Other Ambulatory Visit: Payer: Self-pay

## 2022-07-06 ENCOUNTER — Telehealth: Payer: Self-pay

## 2022-07-06 DIAGNOSIS — K649 Unspecified hemorrhoids: Secondary | ICD-10-CM | POA: Diagnosis not present

## 2022-07-06 DIAGNOSIS — R279 Unspecified lack of coordination: Secondary | ICD-10-CM | POA: Insufficient documentation

## 2022-07-06 DIAGNOSIS — R1011 Right upper quadrant pain: Secondary | ICD-10-CM | POA: Diagnosis not present

## 2022-07-06 DIAGNOSIS — R293 Abnormal posture: Secondary | ICD-10-CM | POA: Diagnosis not present

## 2022-07-06 DIAGNOSIS — K5900 Constipation, unspecified: Secondary | ICD-10-CM | POA: Insufficient documentation

## 2022-07-06 DIAGNOSIS — R140 Abdominal distension (gaseous): Secondary | ICD-10-CM | POA: Insufficient documentation

## 2022-07-06 DIAGNOSIS — M6281 Muscle weakness (generalized): Secondary | ICD-10-CM | POA: Insufficient documentation

## 2022-07-06 NOTE — Addendum Note (Signed)
Addended by: Junie Panning on: 07/06/2022 02:31 PM   Modules accepted: Orders

## 2022-07-06 NOTE — Therapy (Addendum)
OUTPATIENT PHYSICAL THERAPY FEMALE PELVIC EVALUATION   Patient Name: Beverly Moreno MRN: [id number] DOB:[birth year], 81 y.o., female Today's Date: 07/06/2022   PT End of Session - 07/06/22 1017     Visit Number 1    Date for PT Re-Evaluation 10/06/22    Authorization Type medicare A & B/ BCBS supp    PT Start Time 1015    PT Stop Time 1046    PT Time Calculation (min) 31 min    Activity Tolerance Patient tolerated treatment well    Behavior During Therapy WFL for tasks assessed/performed             Past Medical History:  Diagnosis Date   Arthritis    CAD (coronary artery disease)    GERD (gastroesophageal reflux disease)    Heart murmur, systolic    Hyperlipidemia    Hypertension    Obesity    Osteoarthritis    Osteopenia    Sjogren syndrome, unspecified ([facility])    Varicose veins    Past Surgical History:  Procedure Laterality Date   ABDOMINAL HYSTERECTOMY     APPENDECTOMY     age 76   ROTATOR CUFF REPAIR Right    TONSILLECTOMY     as child   TOTAL KNEE ARTHROPLASTY Right 04/09/2014   Procedure: RIGHT TOTAL KNEE ARTHROPLASTY;  Surgeon: Loanne Drilling, MD;  Location: [facility];  Service: Orthopedics;  Laterality: Right;   Patient Active Problem List   Diagnosis Date Noted   Symptomatic varicose veins of both lower extremities 02/06/2016   Postoperative anemia due to acute blood loss 04/10/2014   Unspecified essential hypertension 04/10/2014   OA (osteoarthritis) of knee 04/09/2014    PCP: Daisy Floro, MD  REFERRING PROVIDER: Benancio Deeds, MD  REFERRING DIAG: K59.00 (ICD-10-CM) - Constipation, unspecified constipation type R10.11 (ICD-10-CM) - RUQ pain K64.9 (ICD-10-CM) - Hemorrhoids, unspecified hemorrhoid type R14.0 (ICD-10-CM) - Bloating  THERAPY DIAG:  Muscle weakness (generalized)  Unspecified lack of coordination  Abnormal posture  Rationale for Evaluation and Treatment Rehabilitation  ONSET DATE: for years  SUBJECTIVE:                                                                                                                                                                                            SUBJECTIVE STATEMENT: Pain in upper rt abdominal quadrant, around last rib. Pt reports she has had constipation and this pain for years.   Does workout 2-3x per week with water aerobics   Fluid intake: Yes: 4 tall glasses usually sometimes more, one mug of coffee in am      PAIN:  Are you having pain? Yes (not right now but when it is painful) NPRS scale: 4/10 Pain location:  upper Rt abdominal quadrant  Pain type: sharp Pain description: intermittent   Aggravating factors: worse with constipated, bending forward to tie shoes Relieving factors: straightening out  PRECAUTIONS: None  WEIGHT BEARING RESTRICTIONS No  FALLS:  Has patient fallen in last 6 months? No  LIVING ENVIRONMENT: Lives with: lives with their family Lives in: House/apartment   OCCUPATION: retired  PLOF: Independent  PATIENT GOALS to be more regular with bowels  PERTINENT HISTORY:  CAD, GERD, hypertension, hyperlipidemia, obesity Sexual abuse: No  BOWEL MOVEMENT Pain with bowel movement: No Type of bowel movement:Type (Bristol Stool Scale) 3-4 and sometimes 1, Frequency daily with miralax , and Strain Yes (sometimes but not always) Fully empty rectum: Yes:   Leakage: No Pads: No Fiber supplement: Yes: miralax daily, without reports unable to empty  URINATION Pain with urination: No Fully empty bladder: Yes:   Stream: Strong Urgency: No Frequency: at night usually 1x rarely 2x; no quicker than every 2 hours Leakage:  none Pads: No  INTERCOURSE Pain with intercourse:  not active Ability to have vaginal penetration:  Yes:     PREGNANCY Vaginal deliveries 3 Tearing Yes: with first and does have hemorrhoids   C-section deliveries 0 Currently pregnant No  PROLAPSE None    OBJECTIVE:   DIAGNOSTIC  FINDINGS:    COGNITION:  Overall cognitive status: Within functional limits for tasks assessed     SENSATION:  Light touch: Appears intact  Proprioception: Appears intact  MUSCLE LENGTH: Hamstrings and adductor bil limited by 25%   GAIT: WFL               POSTURE: rounded shoulders and forward head   PELVIC ALIGNMENT:  LUMBARAROM/PROM  A/PROM A/PROM  eval  Flexion Limited by 25%  Extension Limited by 25%  Right lateral flexion WFL  Left lateral flexion WFL  Right rotation WFL  Left rotation WFL   (Blank rows = not tested)  LOWER EXTREMITY ROM:  WFL however mild limitations at Rt knee as pt reports she has TKA here and since has had mild swelling  LOWER EXTREMITY MMT:  Bil hip abductions 3+/5 all other hip 4/5, knees and ankles 5/5   PALPATION:   General  TTP very small tenderness at Rt upper, mid and lower quadrant of abdomen. Rt lateral side of hysterectomy scar mild restrictions and TTP                External Perineal Exam deferred by pt                             Internal Pelvic Floor deferred by pt   Patient confirms identification and approves PT to assess internal pelvic floor and treatment No  PELVIC MMT:    MMT eval  Vaginal   Internal Anal Sphincter   External Anal Sphincter   Puborectalis   Diastasis Recti   (Blank rows = not tested)        TONE: Deferred by pt  PROLAPSE: Deferred by pt  TODAY'S TREATMENT  EVAL Examination completed, findings reviewed, pt educated on POC, abdominal massage and voiding and breathing mechanics. Pt motivated to participate in PT and agreeable to attempt recommendations.     PATIENT EDUCATION:  Education details: abdominal massage and voiding and breathing mechanics.  Person educated: Patient Education method: Explanation, Demonstration, Tactile cues, Verbal  cues, and Handouts Education comprehension: verbalized understanding and returned demonstration   HOME EXERCISE PROGRAM: abdominal massage  and voiding and breathing mechanics.   ASSESSMENT:  CLINICAL IMPRESSION: Patient is a 81 y.o. female  who was seen today for physical therapy evaluation and treatment for chronic constipation. Pt reports she takes miralax daily and this helps but intermittently needs to strain to empty, no other complaints but does have mild TTP at Rt abdomen. Pt found to have mild restrictions in spine and hip flexibility, bil hip weakness, core weakness. Pt deferred internal assessment this date report MD did this and would like to try HEP first. Pt given and educated on abdominal massage, voiding mechanics, and breathing mechanics to decrease strain at pelvic floor. Pt would benefit from additional PT to further address deficits.     OBJECTIVE IMPAIRMENTS decreased coordination, decreased endurance, decreased strength, increased fascial restrictions, impaired flexibility, improper body mechanics, and postural dysfunction.   ACTIVITY LIMITATIONS continence  PARTICIPATION LIMITATIONS: community activity  PERSONAL FACTORS Time since onset of injury/illness/exacerbation and 1 comorbidity: hysterectomy and x3 vaginal births  are also affecting patient's functional outcome.   REHAB POTENTIAL: Good  CLINICAL DECISION MAKING: Stable/uncomplicated  EVALUATION COMPLEXITY: Low   GOALS: Goals reviewed with patient? Yes  SHORT TERM GOALS: Target date: 08/03/2022  Pt to be I with HEP.  Baseline: Goal status: INITIAL  2.  Pt will report 25% less  discomfort due to improved muscle tone throughout the core  Baseline:  Goal status: INITIAL  3.  Pt will report her BMs are complete with less strain due to improved bowel habits and evacuation techniques.  Baseline:  Goal status: INITIAL  4.  Pt to be I with abdominal massage and voiding mechanics to decrease strain at pelvic floor.  Baseline:  Goal status: INITIAL   LONG TERM GOALS: Target date:  10/06/22    Pt to be I with advanced HEP.  Baseline:  Goal  status: INITIAL  2.  Pt to demonstrate at least 4/5 bil hip strength for improved pelvic stability and functional squats without leakage.  Baseline:  Goal status: INITIAL  3.  Pt will report 50% less  discomfort due to improved muscle tone throughout the core  Baseline:  Goal status: INITIAL  4.  Pt to demonstrate no abdominal fascial restrictions due to improved voiding mechanics and abdominal massage.  Baseline:  Goal status: INITIAL  5.  Pt to demonstrate improved coordination of pelvic floor and breathing mechanics with body weight squat for improved pelvic floor strength and stability for decreased strain.  Baseline:  Goal status: INITIAL   PLAN: PT FREQUENCY:  every other week   PT DURATION:  6 sessions  PLANNED INTERVENTIONS: Therapeutic exercises, Therapeutic activity, Neuromuscular re-education, Patient/Family education, Joint mobilization, Aquatic Therapy, Dry Needling, Spinal mobilization, Cryotherapy, Moist heat, Manual lymph drainage, scar mobilization, Taping, Biofeedback, and Manual therapy  PLAN FOR NEXT SESSION: go over handouts if needed, internal if pt consents and appropriate, and scar mob   Stacy Gardner, PT, DPT 07/06/2309:54 AM    PHYSICAL THERAPY DISCHARGE SUMMARY  Visits from Start of Care: 1  Current functional level related to goals / functional outcomes: Pt reports she is no longer having symptoms and denies additional needs for PT at this time requesting DC.    Remaining deficits: None per pt   Education / Equipment: abdominal massage and voiding and breathing mechanics.   Patient agrees to discharge. Patient goals were partially met. Patient is being discharged due to  being pleased with the current functional level.  Stacy Gardner, PT, DPT 08/04/2311:56 PM

## 2022-07-06 NOTE — Telephone Encounter (Signed)
Called Dr. Alan Ripper office at [place] at [phone] to follow up on request for labs sent on 06-03-22.  Was on hold for over 20 minutes on 2 occasions.  Faxed request to [phone] to please fax [place] recent labs

## 2022-07-21 ENCOUNTER — Encounter: Payer: Self-pay | Admitting: Gastroenterology

## 2022-07-21 ENCOUNTER — Other Ambulatory Visit (INDEPENDENT_AMBULATORY_CARE_PROVIDER_SITE_OTHER): Payer: Medicare Other

## 2022-07-21 ENCOUNTER — Ambulatory Visit (INDEPENDENT_AMBULATORY_CARE_PROVIDER_SITE_OTHER): Payer: Medicare Other | Admitting: Gastroenterology

## 2022-07-21 VITALS — BP 140/70 | HR 62 | Ht 63.0 in | Wt 200.0 lb

## 2022-07-21 DIAGNOSIS — K5900 Constipation, unspecified: Secondary | ICD-10-CM

## 2022-07-21 DIAGNOSIS — R140 Abdominal distension (gaseous): Secondary | ICD-10-CM | POA: Diagnosis not present

## 2022-07-21 DIAGNOSIS — K649 Unspecified hemorrhoids: Secondary | ICD-10-CM

## 2022-07-21 DIAGNOSIS — R1011 Right upper quadrant pain: Secondary | ICD-10-CM | POA: Diagnosis not present

## 2022-07-21 LAB — BUN: BUN: 18 mg/dL (ref 6–23)

## 2022-07-21 LAB — CREATININE, SERUM: Creatinine, Ser: 0.84 mg/dL (ref 0.40–1.20)

## 2022-07-21 NOTE — Progress Notes (Signed)
HPI :  81 year old female here for a follow up visit for abdominal pain, constipation, bloating, hemorrhoids.  I saw her initially on June 7 of this year, please see that note for full details of her intake history.  Recall she had been having some right sided abdominal pain for a few years or so.  Usually present to some extent all the time that is mild, does not really bother her too much, but can fluctuate in severity.  When she bends over to tie her shoes she can feel the pain worsen and with movements like that.  She has some chronic constipation at baseline and was wondering if that was contributing to this as well.  There is no relation of her pain to bowel movements or eating, no vomiting.  Recall she had been using prunes for her constipation but her symptoms persisted and she was having some scant bleeding thought to be related to hemorrhoids.  At her last visit we got copies of her basic labs.  We had thought this was done fairly recent but actually her last labs were done in May 2022, all were normal as outlined below.  I thought she had a component of pelvic floor dysfunction causing her constipation referred her to pelvic floor PT who she has seen.  They are seeing her back again in another 2 weeks or so.  I had recommended she transition from prune juice to MiraLAX for her bowels.  She has been taking the MiraLAX routinely and this is provided significant benefit for her bowels.  She is doing much better in this regard.  She traveled to [place] for 2 weeks, states she had a very good trip and her bowels function much better.  Generally she is feeling okay.  She denies any worsening of her abdominal pain which is about the same.  She is bit concerned it persisted over time and nothing really seems to help it or make it worse.  I had also recommended some Calmol 4 suppositories for her hemorrhoids, she seems to be doing pretty well in regards to that.   She denies any family history of colon  cancer.  Her mother had diverticulitis.  Last colonoscopy was by Earle Gell in February 2007 and the exam was normal with excellent prep.  That was done at the age of 48.    Colonoscopy - February 2007 - Dr. Earle Gell -excellent prep, normal  Labs 05/21/21: scanned into Epic Normal iron studies Normal TSH Normal CMET Normal CBC   Past Medical History:  Diagnosis Date   Arthritis    CAD (coronary artery disease)    Constipation    GERD (gastroesophageal reflux disease)    Heart murmur, systolic    Hyperlipidemia    Hypertension    Obesity    Osteoarthritis    Osteopenia    Sjogren syndrome, unspecified ([place])    Varicose veins      Past Surgical History:  Procedure Laterality Date   ABDOMINAL HYSTERECTOMY     APPENDECTOMY     age 27   ROTATOR CUFF REPAIR Right    TONSILLECTOMY     as child   TOTAL KNEE ARTHROPLASTY Right 04/09/2014   Procedure: RIGHT TOTAL KNEE ARTHROPLASTY;  Surgeon: Gearlean Alf, MD;  Location: [facility];  Service: Orthopedics;  Laterality: Right;   Family History  Problem Relation Age of Onset   Heart disease Mother    Heart attack Mother    Anuerysm Father  Brain   Hypertension Father    Gallstones Father    Kidney Stones Sister    Breast cancer Maternal Grandmother    Lung cancer Maternal Grandfather    Social History   Tobacco Use   Smoking status: Former    Types: Cigarettes    Quit date: 02/06/1979    Years since quitting: 43.4   Smokeless tobacco: Never  Vaping Use   Vaping Use: Never used  Substance Use Topics   Alcohol use: Yes    Alcohol/week: 0.0 standard drinks of alcohol    Comment: wine weekly   Drug use: No   Current Outpatient Medications  Medication Sig Dispense Refill   aspirin 81 MG tablet Take 81 mg by mouth daily.     atorvastatin (LIPITOR) 20 MG tablet Take 20 mg by mouth daily.     Cholecalciferol (VITAMIN D3) 2000 units TABS Take by mouth daily.     Glycerin-Polysorbate 80 (REFRESH DRY EYE  THERAPY OP) Place 3 drops into both eyes daily.     ibuprofen (ADVIL,MOTRIN) 200 MG tablet Take 200 mg by mouth every 6 (six) hours as needed.     MAGNESIUM GLYCINATE PO Take 400 mg by mouth daily.     Multiple Vitamin (MULTIVITAMIN) tablet Take 1 tablet by mouth daily. Nutrafol     Multiple Vitamins-Minerals (OCUVITE EYE HEALTH FORMULA) CAPS Take 1 tablet by mouth daily.     olmesartan-hydrochlorothiazide (BENICAR HCT) 40-12.5 MG tablet Take 1 tablet by mouth daily.     polyethylene glycol powder (GLYCOLAX/MIRALAX) 17 GM/SCOOP powder Take 17 g by mouth daily. Increase to twice a day if needed     Rectal Protectant-Emollient (CALMOL-4) 76-10 % SUPP Use as directed once to twice daily 6 suppository 0   vitamin C (ASCORBIC ACID) 500 MG tablet Take 500 mg by mouth daily.     No current facility-administered medications for this visit.   Allergies  Allergen Reactions   Celebrex [Celecoxib] Other (See Comments)    Made her feel awful     Review of Systems: All systems reviewed and negative except where noted in HPI.    See HPI for labs  Physical Exam: BP 140/70   Pulse 62   Ht '5\' 3"'$  (1.6 m)   Wt 200 lb (90.7 kg)   BMI 35.43 kg/m  Constitutional: Pleasant,well-developed, female in no acute distress. Neurological: Alert and oriented to person place and time. Psychiatric: Normal mood and affect. Behavior is normal.   ASSESSMENT AND PLAN: 81 y/o female here for reassessment of the following:  Abdominal pain Constipation Bloating Hemorrhoids  Since have last seen her constipation/bloating/hemorrhoids are all doing better.  She has replaced prune juice with MiraLAX and appears to be working better and tolerating it better.  She has seen pelvic floor PT and will continue to see them as needed for component of pelvic floor dysfunction.  Her yearly labs have been normal, no anemia.  Do not feel strongly that she needs any colonoscopy at this point time given she is responding well to  therapy and her labs look okay.  She is due for yearly labs with her physical which she is getting done next month with her primary care.  She will continue her present regimen for now.  Otherwise her abdominal pain persists.  Based on history and prior exam I suspect this is more likely musculoskeletal/neuropathic in nature, given positional changes to it.  She remains a bit anxious about this however and it has persisted over  time.  I discussed options with her.  I offered her a CT scan of the abdomen pelvis to make sure no concerning pathology there and provide reassurance given her persistent symptoms.  Discussed what this is and risks.  She needs up-to-date BUN and creatinine to make sure normal and if so we will proceed with contrast study.  Further recommendations pending the results.  Plan: - CT abdomen / pelvis with contrast - BUN and Cr to be done prior to this to make sure normal renal function - yearly labs per PCP at physical next month - continue Miralax and Calmol 4 PRN  Jolly Mango, MD [facility]

## 2022-07-21 NOTE — Patient Instructions (Addendum)
If you are age 82 or older, your body mass index should be between 23-30. Your Body mass index is 35.43 kg/m. If this is out of the aforementioned range listed, please consider follow up with your Primary Care Provider.  If you are age 15 or younger, your body mass index should be between 19-25. Your Body mass index is 35.43 kg/m. If this is out of the aformentioned range listed, please consider follow up with your Primary Care Provider.   ________________________________________________________  The [facility] providers would like to encourage you to use [facility] to communicate with providers for non-urgent requests or questions.  Due to long hold times on the telephone, sending your provider a message by [facility] may be a faster and more efficient way to get a response.  Please allow 48 business hours for a response.  Please remember that this is for non-urgent requests.  _______________________________________________________  Please go to the lab in the basement of our building to have lab work done as you leave today. Hit "B" for basement when you get on the elevator.  When the doors open the lab is on your left.  We will call you with the results. Thank you.  _____________________________________________________________  Beverly Moreno have been scheduled for a CT scan of the abdomen and pelvis at [facility], 1st floor Radiology. You are scheduled on Friday, August 11th at 8:00 am. You should arrive 15 minutes prior to your appointment time for registration. The solution may taste better if refrigerated, but do NOT add ice or any other liquid to this solution. Shake well before drinking.   Please follow the written instructions below on the day of your exam:   1) Do not eat anything after 4:00 am (4 hours prior to your test)   2) Drink 1 bottle of contrast @ 6:00 am (2 hours prior to your exam)  Remember to shake well before drinking and do NOT pour over ice.     Drink 1 bottle of contrast @  7:00 am (1 hour prior to your exam)   You may take any medications as prescribed with a small amount of water, if necessary. If you take any of the following medications: METFORMIN, GLUCOPHAGE, GLUCOVANCE, AVANDAMET, RIOMET, FORTAMET, [place] MET, JANUMET, GLUMETZA or METAGLIP, you MAY be asked to HOLD this medication 48 hours AFTER the exam.   The purpose of you drinking the oral contrast is to aid in the visualization of your intestinal tract. The contrast solution may cause some diarrhea. Depending on your individual set of symptoms, you may also receive an intravenous injection of x-ray contrast/dye. Plan on being at [facility] for 45 minutes or longer, depending on the type of exam you are having performed.   If you have any questions regarding your exam or if you need to reschedule, you may call [facility] at [phone] between the hours of 8:00 am and 5:00 pm, Monday-Friday.   Continue Miralax.  Thank you for entrusting me with your care and for choosing [facility], Dr. Albemarle Cellar

## 2022-08-04 ENCOUNTER — Ambulatory Visit: Payer: Medicare Other | Admitting: Physical Therapy

## 2022-08-07 ENCOUNTER — Ambulatory Visit (HOSPITAL_COMMUNITY)
Admission: RE | Admit: 2022-08-07 | Discharge: 2022-08-07 | Disposition: A | Discharge status: Home / Self Care | Payer: Medicare Other | Source: Ambulatory Visit | Attending: Gastroenterology | Admitting: Gastroenterology

## 2022-08-07 DIAGNOSIS — R1011 Right upper quadrant pain: Secondary | ICD-10-CM | POA: Insufficient documentation

## 2022-08-07 DIAGNOSIS — R109 Unspecified abdominal pain: Secondary | ICD-10-CM | POA: Diagnosis not present

## 2022-08-07 MED ORDER — IOHEXOL 300 MG/ML  SOLN
100.0000 mL | Freq: Once | INTRAMUSCULAR | Status: AC | PRN
  Administered 2022-08-07: 100 mL via INTRAVENOUS

## 2022-08-07 MED ORDER — SODIUM CHLORIDE (PF) 0.9 % IJ SOLN
INTRAMUSCULAR | Status: AC
  Filled 2022-08-07: qty 50

## 2022-08-13 DIAGNOSIS — E782 Mixed hyperlipidemia: Secondary | ICD-10-CM | POA: Diagnosis not present

## 2022-08-13 DIAGNOSIS — I10 Essential (primary) hypertension: Secondary | ICD-10-CM | POA: Diagnosis not present

## 2022-08-18 ENCOUNTER — Encounter: Payer: Medicare Other | Admitting: Physical Therapy

## 2022-08-28 ENCOUNTER — Other Ambulatory Visit: Payer: Self-pay | Admitting: Family Medicine

## 2022-08-28 DIAGNOSIS — Z1231 Encounter for screening mammogram for malignant neoplasm of breast: Secondary | ICD-10-CM

## 2022-09-01 ENCOUNTER — Encounter: Payer: Medicare Other | Admitting: Physical Therapy

## 2022-09-15 ENCOUNTER — Encounter: Payer: Medicare Other | Admitting: Physical Therapy

## 2022-09-22 ENCOUNTER — Ambulatory Visit: Payer: Medicare Other

## 2022-09-22 ENCOUNTER — Ambulatory Visit
Admission: RE | Admit: 2022-09-22 | Discharge: 2022-09-22 | Disposition: A | Discharge status: Home / Self Care | Payer: Medicare Other | Source: Ambulatory Visit | Attending: Family Medicine | Admitting: Family Medicine

## 2022-09-22 DIAGNOSIS — Z1231 Encounter for screening mammogram for malignant neoplasm of breast: Secondary | ICD-10-CM

## 2022-09-24 ENCOUNTER — Other Ambulatory Visit: Payer: Self-pay | Admitting: Family Medicine

## 2022-09-24 DIAGNOSIS — R928 Other abnormal and inconclusive findings on diagnostic imaging of breast: Secondary | ICD-10-CM

## 2022-09-29 ENCOUNTER — Encounter: Payer: Medicare Other | Admitting: Physical Therapy

## 2022-10-02 ENCOUNTER — Ambulatory Visit: Payer: Medicare Other

## 2022-10-02 ENCOUNTER — Ambulatory Visit
Admission: RE | Admit: 2022-10-02 | Discharge: 2022-10-02 | Disposition: A | Discharge status: Home / Self Care | Payer: Medicare Other | Source: Ambulatory Visit | Attending: Family Medicine | Admitting: Family Medicine

## 2022-10-02 DIAGNOSIS — R928 Other abnormal and inconclusive findings on diagnostic imaging of breast: Secondary | ICD-10-CM

## 2022-10-12 DIAGNOSIS — Z23 Encounter for immunization: Secondary | ICD-10-CM | POA: Diagnosis not present

## 2022-10-13 ENCOUNTER — Encounter: Payer: Medicare Other | Admitting: Physical Therapy

## 2022-11-02 DIAGNOSIS — Z23 Encounter for immunization: Secondary | ICD-10-CM | POA: Diagnosis not present

## 2022-11-03 IMAGING — MG MM DIGITAL SCREENING BILAT W/ TOMO AND CAD
8 series · 8 of 24 positions shown · non-contrast
Comparison: Previous exam(s).

CLINICAL DATA: Screening.

EXAM:
DIGITAL SCREENING BILATERAL MAMMOGRAM WITH TOMOSYNTHESIS AND CAD
TECHNIQUE: Bilateral screening digital craniocaudal and mediolateral oblique
mammograms were obtained. Bilateral screening digital breast
tomosynthesis was performed. The images were evaluated with
computer-aided detection.

[R MLO synth-2D]
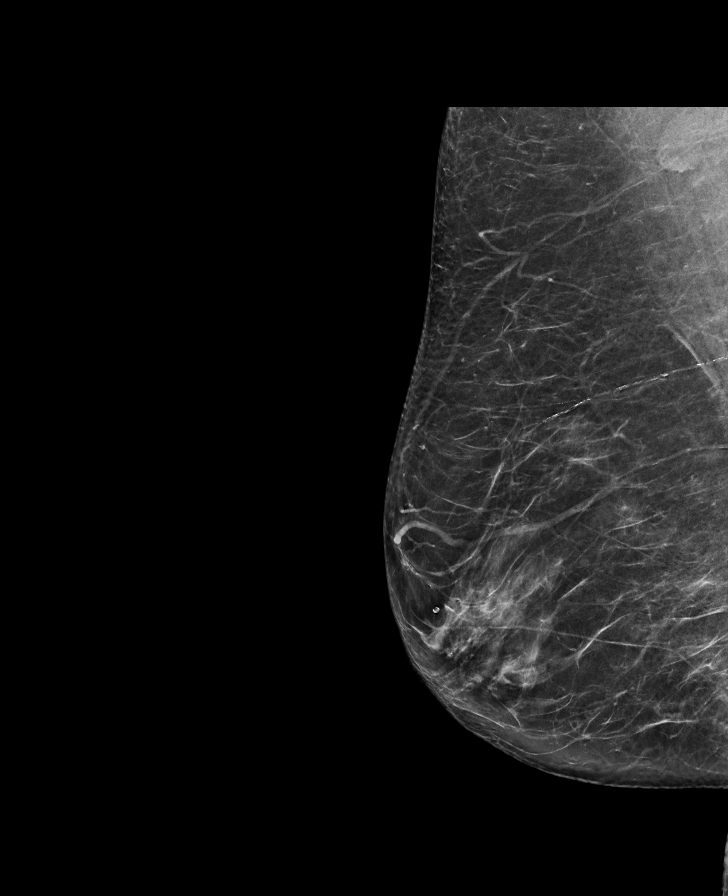

[R CC synth-2D]
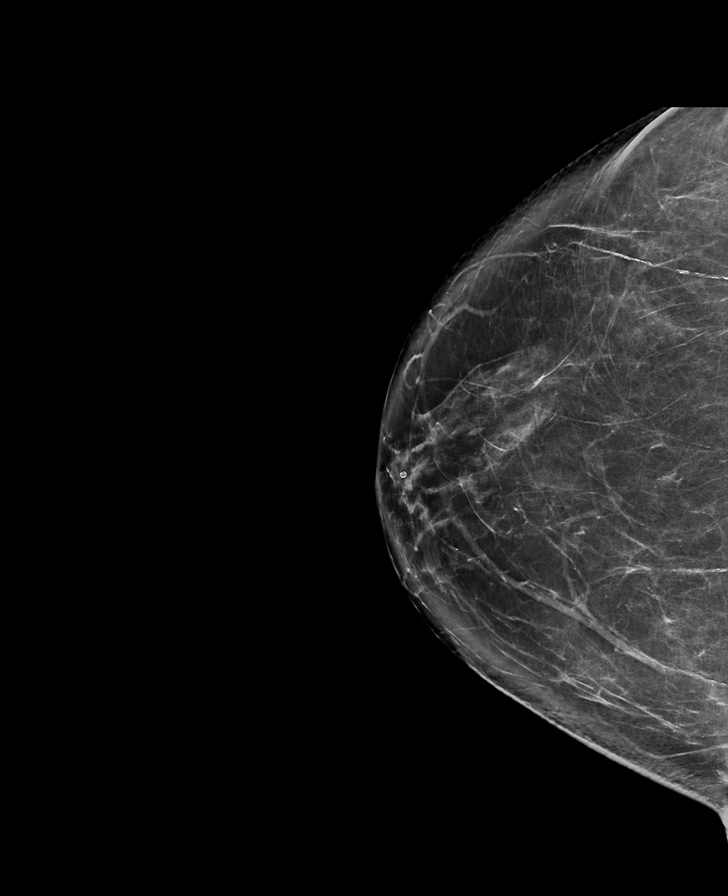

[L CC synth-2D]
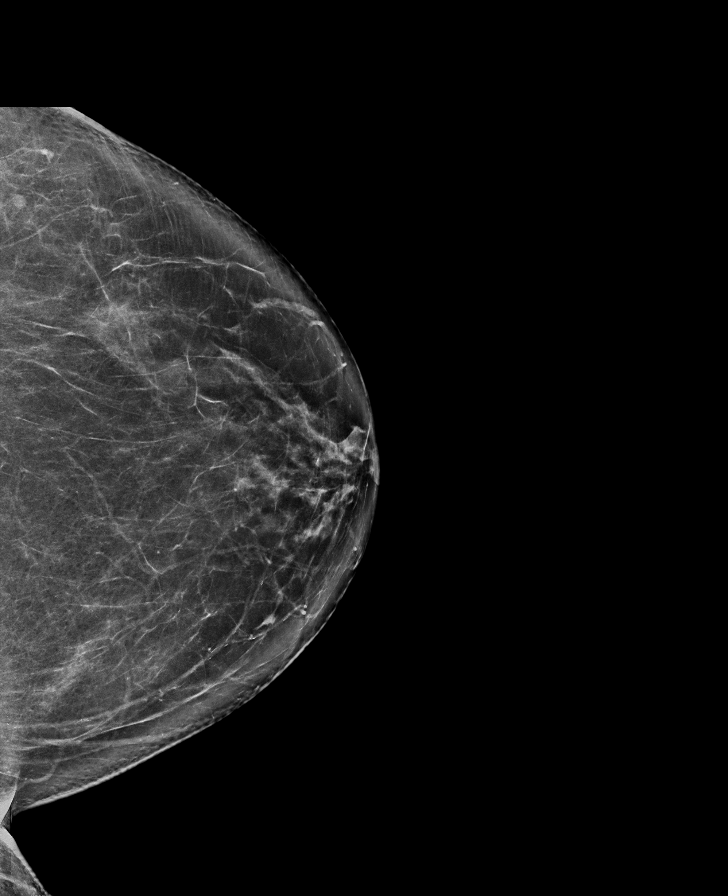

[L MLO synth-2D]
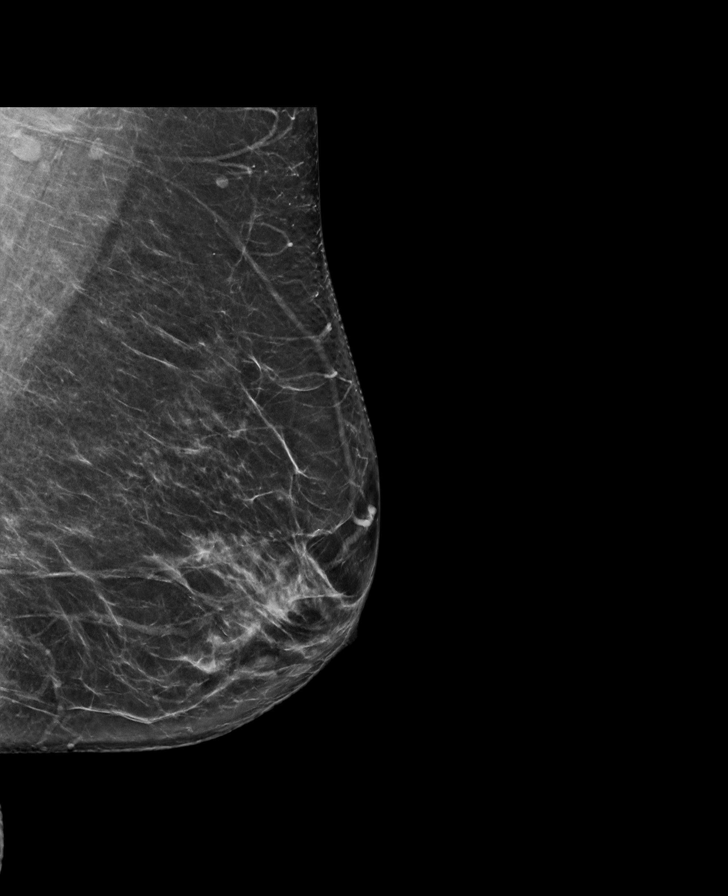

[L CC tomo · tomo slice 41/82.0]
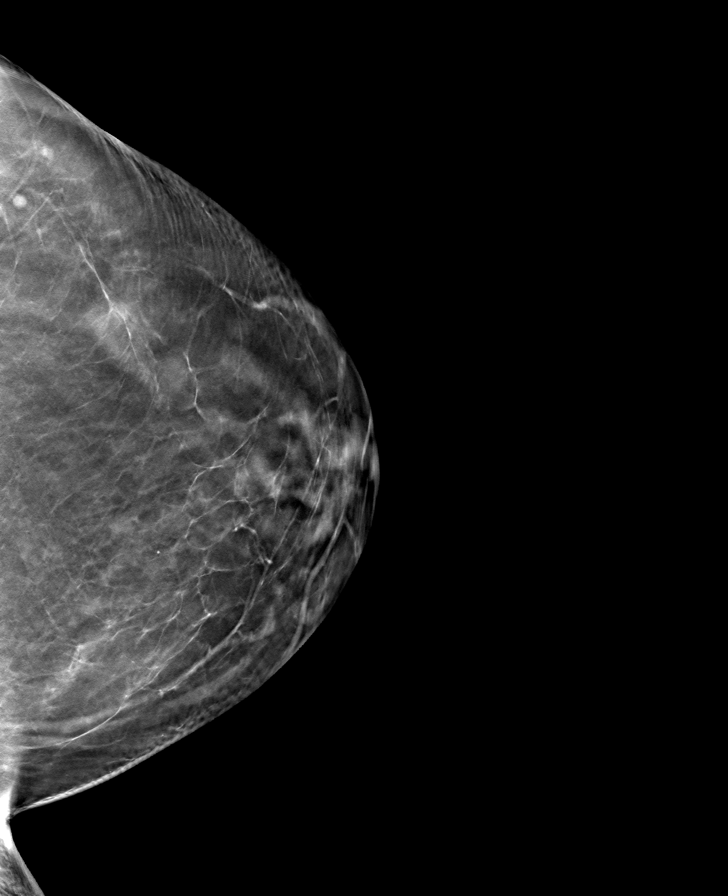

[R MLO tomo · tomo slice 39/78.0]
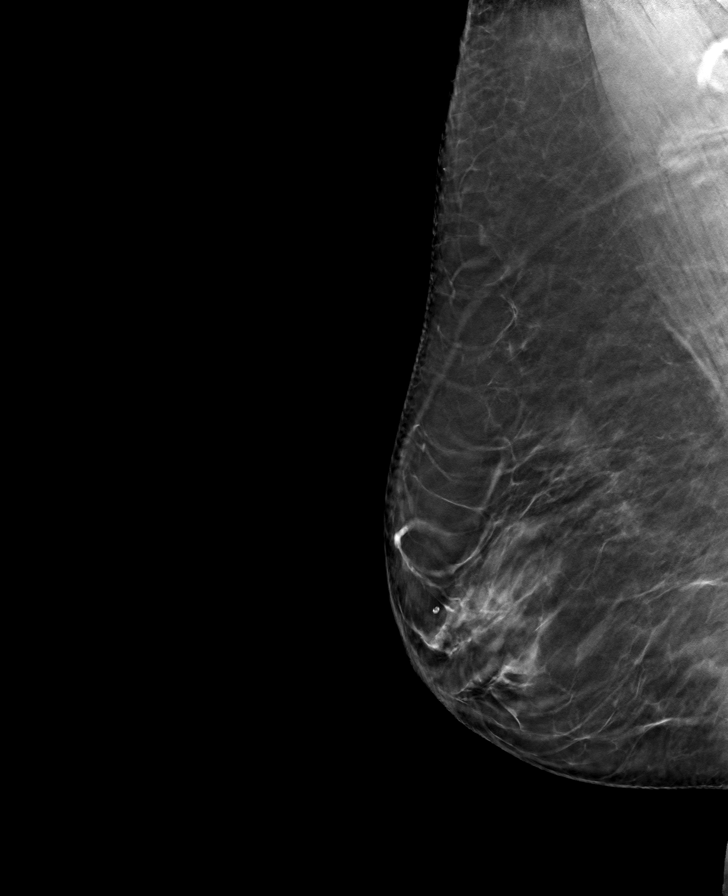

[L MLO tomo · tomo slice 39/76.0]
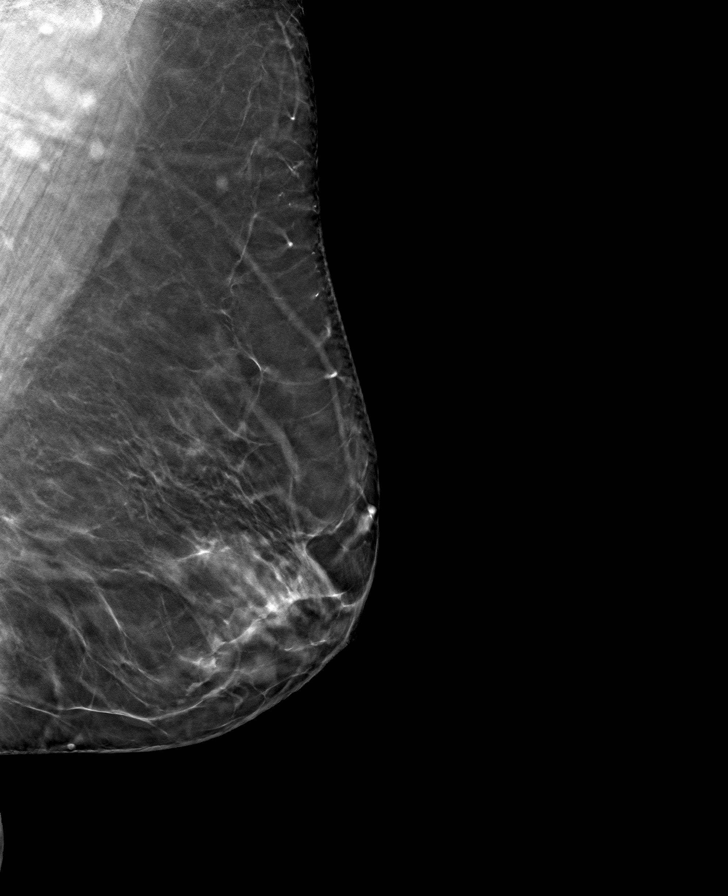

[R CC tomo · tomo slice 43/84.0]
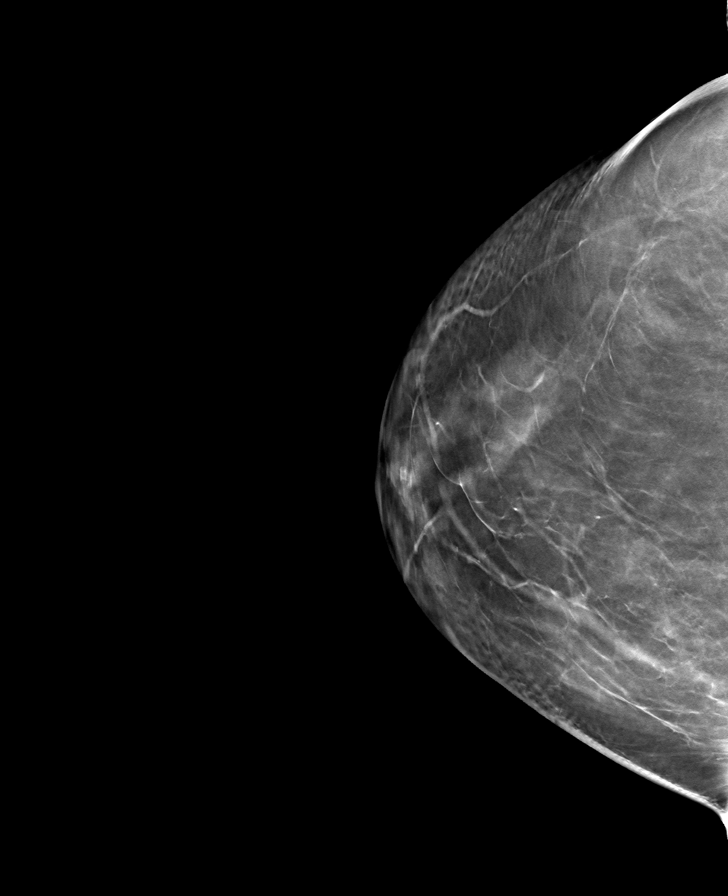

[8 of 24 positions shown; findings below may reference images not displayed]

ACR Breast Density Category b: There are scattered areas of
fibroglandular density.
FINDINGS: There are no findings suspicious for malignancy. The images were
evaluated with computer-aided detection.
IMPRESSION: No mammographic evidence of malignancy. A result letter of this
screening mammogram will be mailed directly to the patient.

RECOMMENDATION:
Screening mammogram in one year. (Code:[id number])

BI-RADS CATEGORY  1: Negative.

## 2023-01-16 ENCOUNTER — Other Ambulatory Visit (HOSPITAL_BASED_OUTPATIENT_CLINIC_OR_DEPARTMENT_OTHER): Payer: Self-pay

## 2023-02-12 DIAGNOSIS — Z03818 Encounter for observation for suspected exposure to other biological agents ruled out: Secondary | ICD-10-CM | POA: Diagnosis not present

## 2023-02-12 DIAGNOSIS — R0981 Nasal congestion: Secondary | ICD-10-CM | POA: Diagnosis not present

## 2023-02-12 DIAGNOSIS — R002 Palpitations: Secondary | ICD-10-CM | POA: Diagnosis not present

## 2023-02-12 DIAGNOSIS — Z6834 Body mass index (BMI) 34.0-34.9, adult: Secondary | ICD-10-CM | POA: Diagnosis not present

## 2023-02-12 DIAGNOSIS — E782 Mixed hyperlipidemia: Secondary | ICD-10-CM | POA: Diagnosis not present

## 2023-02-12 DIAGNOSIS — R0602 Shortness of breath: Secondary | ICD-10-CM | POA: Diagnosis not present

## 2023-02-12 DIAGNOSIS — R42 Dizziness and giddiness: Secondary | ICD-10-CM | POA: Diagnosis not present

## 2023-02-16 DIAGNOSIS — M546 Pain in thoracic spine: Secondary | ICD-10-CM | POA: Diagnosis not present

## 2023-02-16 DIAGNOSIS — H8110 Benign paroxysmal vertigo, unspecified ear: Secondary | ICD-10-CM | POA: Diagnosis not present

## 2023-02-17 NOTE — Therapy (Signed)
OUTPATIENT PHYSICAL THERAPY VESTIBULAR EVALUATION     Patient Name: Beverly Moreno MRN: [id number] DOB:[birth year], 82 y.o., female Today's Date: 02/18/2023  END OF SESSION:  PT End of Session - 02/18/23 1238     Visit Number 1    Number of Visits 1    Date for PT Re-Evaluation 02/18/23    Authorization Type Medicare/BCBS    PT Start Time 1100    PT Stop Time 1142    PT Time Calculation (min) 42 min    Activity Tolerance Patient tolerated treatment well    Behavior During Therapy WFL for tasks assessed/performed             Past Medical History:  Diagnosis Date   Arthritis    CAD (coronary artery disease)    Constipation    GERD (gastroesophageal reflux disease)    Heart murmur, systolic    Hyperlipidemia    Hypertension    Obesity    Osteoarthritis    Osteopenia    Sjogren syndrome, unspecified ([place])    Varicose veins    Past Surgical History:  Procedure Laterality Date   ABDOMINAL HYSTERECTOMY     APPENDECTOMY     age 25   ROTATOR CUFF REPAIR Right    TONSILLECTOMY     as child   TOTAL KNEE ARTHROPLASTY Right 04/09/2014   Procedure: RIGHT TOTAL KNEE ARTHROPLASTY;  Surgeon: Gearlean Alf, MD;  Location: [facility];  Service: Orthopedics;  Laterality: Right;   Patient Active Problem List   Diagnosis Date Noted   Symptomatic varicose veins of both lower extremities 02/06/2016   Postoperative anemia due to acute blood loss 04/10/2014   Unspecified essential hypertension 04/10/2014   OA (osteoarthritis) of knee 04/09/2014    PCP: Lawerance Cruel, MD  REFERRING PROVIDER: Lurline Del, DO  REFERRING DIAG:  H81.10 (ICD-10-CM) - Benign paroxysmal vertigo, unspecified ear  THERAPY DIAG:  Dizziness and giddiness  ONSET DATE:  1 week  Rationale for Evaluation and Treatment: Rehabilitation  SUBJECTIVE:   SUBJECTIVE STATEMENT: Patient reports hx of dizziness 2-3 years ago and it was very violent. Reports this resolved with removing wax from her  ears and "the maneuver." 6-7 days ago she woke up with dizziness, much less intense than before. Dizziness episodes last seconds-minutes and worse with getting up from a chair, bending. Reports moving very slowly and trying to avoid activities that aggravate symptoms, so she hasn't had as much dizziness for the last couple of days. Denies head trauma, infection/illness, vision changes/double vision, hearing loss, tinnitus, migraines. More recently, she woke up with severe pain from neck to back on the 16th- still having pain and having pain to roll in bed so she is sleeping in a recliner. Denies N/T, radiation, B&B changes.   Pt accompanied by: self  PERTINENT HISTORY: CAD, GERD, HLD, HTN, sjogren syndrome, R RTC repair, R TKA  PAIN:  Are you having pain? Yes: NPRS scale: 7/10 Pain location: spine Pain description: "just there" Aggravating factors: rolling in bed,  Relieving factors: salonpas patches  PRECAUTIONS: Fall  WEIGHT BEARING RESTRICTIONS: No  FALLS: Has patient fallen in last 6 months? No  LIVING ENVIRONMENT: Lives with: lives with their spouse Lives in: House/apartment Stairs:  2 story home but lives on 1st floor Has following equipment at home: Single point cane  PLOF: Independent  PATIENT GOALS: decrease dizziness   OBJECTIVE:   DIAGNOSTIC FINDINGS: none recent  COGNITION: Overall cognitive status: Within functional limits for tasks assessed  SENSATION: WFL   POSTURE:  rounded shoulders and forward head  GAIT: Gait pattern: Decreased speed and decreased B step length Assistive device utilized: Single point cane Level of assistance: Modified independence   PATIENT SURVEYS:  FOTO 39; predicted: 61  VESTIBULAR ASSESSMENT:  GENERAL OBSERVATION: pt wears bifocals when drving   OCULOMOTOR EXAM:  Ocular Alignment: normal  Ocular ROM: No Limitations  Spontaneous Nystagmus: absent  Gaze-Induced Nystagmus: absent  Smooth Pursuits: intact  Saccades:  intact   VESTIBULAR - OCULAR REFLEX:   Slow VOR: Normal horizontal and vertical; no dizziness but c/o neck pain   VOR Cancellation: Normal; c/o mild wooziness   Head-Impulse Test: HIT Right: negative HIT Left: negative *HIT limited by muscle guarding     POSITIONAL TESTING:  Right Roll Test: negative Left Roll Test: negative Right Dix-Hallpike: negative Left Dix-Hallpike: negative Right Sidelying: negative; c/o mild wooziness upon sitting up Left Sidelying: negative    MOTION SENSITIVITY:  Motion Sensitivity Quotient Intensity: 0 = none, 1 = Lightheaded, 2 = Mild, 3 = Moderate, 4 = Severe, 5 = Vomiting  Intensity  1. Sitting to supine   2. Supine to L side   3. Supine to R side   4. Supine to sitting   5. L Hallpike-Dix   6. Up from L    7. R Hallpike-Dix   8. Up from R    9. Sitting, head tipped to L knee   10. Head up from L knee   11. Sitting, head tipped to R knee   12. Head up from R knee   13. Sitting head turns x5   14.Sitting head nods x5   15. In stance, 180 turn to L    16. In stance, 180 turn to R       VESTIBULAR TREATMENT:                                                                                                   DATE: 02/18/23   PATIENT EDUCATION: Education details: edu moist heat to LB for 10-15 min, HEP- advised to stop if pain occurs; provided contact info for [employer] for Orthopedic PT; edu on exam findings Person educated: Patient Education method: Explanation, Demonstration, Tactile cues, Verbal cues, and Handouts Education comprehension: verbalized understanding  HOME EXERCISE PROGRAM: Access Code: [email or web address] URL: [email or web address] Date: 02/18/2023 Prepared by: [facility]  Program Notes do not push into pain and discontinue if pain occurs  Exercises - Supine Lower Trunk Rotation  - 1 x daily - 5 x weekly - 2 sets - 10 reps - Seated Pelvic Tilt  - 1 x daily - 5  x weekly - 2 sets - 10 reps - Seated Flexion Stretch with Swiss Ball  - 1 x daily - 5 x weekly - 2 sets - 10 reps - 5 sec hold - Seated Cervical Rotation AROM  - 1 x daily - 5 x weekly - 2 sets - 10 reps - Seated Cervical Flexion AROM  - 1 x daily - 5 x weekly -  2 sets - 10 reps - Seated Cervical Extension AROM  - 1 x daily - 5 x weekly - 2 sets - 10 reps   GOALS: Goals reviewed with patient? Yes  STGs=LTGs: Target date: 02/18/23  Patient to report understanding of HEP. Baseline: HEP initiated Goal status: MET   ASSESSMENT:  CLINICAL IMPRESSION:   Patient is an 82 y/o F presenting to OPPT with c/o dizziness for the past week and acute neck and LBP for the past couple of days. Dizziness episodes last seconds-minutes and worse with getting up from a chair, bending. Reports she hasn't had as much dizziness for the last couple of days. Denies head trauma, infection/illness, vision changes/double vision, hearing loss, tinnitus, migraines. Patient today presented with mild dizziness with VOR cancellation and upon sitting up from R sidelying. Vestibular assessment otherwise negative. Patient reported pain as main concern at this time, thus provided gentle stretching HEP for neck/back and info for Orthopedic PT clinic. Placing patient on 30 day hold if vertigo returns.   OBJECTIVE IMPAIRMENTS: dizziness and pain.   ACTIVITY LIMITATIONS: bed mobility  PARTICIPATION LIMITATIONS: cleaning  PERSONAL FACTORS: Age, Past/current experiences, Time since onset of injury/illness/exacerbation, and 3+ comorbidities: CAD, GERD, HLD, HTN, sjogren syndrome, R RTC repair, R TKA  are also affecting patient's functional outcome.   REHAB POTENTIAL: Good  CLINICAL DECISION MAKING: Evolving/moderate complexity  EVALUATION COMPLEXITY: Moderate   PLAN:  PT FREQUENCY: one time visit  PT DURATION: 30 days  PLANNED INTERVENTIONS: Therapeutic exercises, Therapeutic activity, Neuromuscular re-education,  Balance training, Gait training, Patient/Family education, Self Care, Joint mobilization, Stair training, Vestibular training, Canalith repositioning, Aquatic Therapy, Dry Needling, Cryotherapy, Moist heat, Taping, Manual therapy, and Re-evaluation  PLAN FOR NEXT SESSION: 30 day hold at this time   Janene Harvey, PT, DPT 02/18/23 12:47 PM  [facility] at [facility] [address] [place], [place] [address] Phone # [phone] Fax # [phone]

## 2023-02-18 ENCOUNTER — Other Ambulatory Visit: Payer: Self-pay

## 2023-02-18 ENCOUNTER — Ambulatory Visit: Payer: Medicare Other | Attending: Family Medicine | Admitting: Physical Therapy

## 2023-02-18 ENCOUNTER — Encounter: Payer: Self-pay | Admitting: Physical Therapy

## 2023-02-18 DIAGNOSIS — R42 Dizziness and giddiness: Secondary | ICD-10-CM | POA: Diagnosis not present

## 2023-03-01 ENCOUNTER — Other Ambulatory Visit (HOSPITAL_BASED_OUTPATIENT_CLINIC_OR_DEPARTMENT_OTHER): Payer: Self-pay

## 2023-03-01 DIAGNOSIS — M542 Cervicalgia: Secondary | ICD-10-CM | POA: Diagnosis not present

## 2023-03-01 DIAGNOSIS — R5381 Other malaise: Secondary | ICD-10-CM | POA: Diagnosis not present

## 2023-03-01 DIAGNOSIS — Z6834 Body mass index (BMI) 34.0-34.9, adult: Secondary | ICD-10-CM | POA: Diagnosis not present

## 2023-03-01 DIAGNOSIS — R42 Dizziness and giddiness: Secondary | ICD-10-CM | POA: Diagnosis not present

## 2023-03-01 MED ORDER — METHOCARBAMOL 500 MG PO TABS
500.0000 mg | ORAL_TABLET | ORAL | 1 refills | Status: AC | PRN
  Filled 2023-03-01: qty 30, 5d supply, fill #0
  Filled 2023-10-26: qty 30, 5d supply, fill #1

## 2023-03-01 MED ORDER — PREDNISONE 10 MG PO TABS
ORAL_TABLET | ORAL | 0 refills | Status: DC
  Filled 2023-03-01: qty 30, 12d supply, fill #0

## 2023-03-03 DIAGNOSIS — H43813 Vitreous degeneration, bilateral: Secondary | ICD-10-CM | POA: Diagnosis not present

## 2023-03-03 DIAGNOSIS — H04223 Epiphora due to insufficient drainage, bilateral lacrimal glands: Secondary | ICD-10-CM | POA: Diagnosis not present

## 2023-03-03 DIAGNOSIS — H04123 Dry eye syndrome of bilateral lacrimal glands: Secondary | ICD-10-CM | POA: Diagnosis not present

## 2023-03-03 DIAGNOSIS — H353131 Nonexudative age-related macular degeneration, bilateral, early dry stage: Secondary | ICD-10-CM | POA: Diagnosis not present

## 2023-04-06 ENCOUNTER — Ambulatory Visit: Payer: Medicare Other | Attending: Interventional Cardiology | Admitting: Interventional Cardiology

## 2023-04-06 VITALS — BP 160/72 | HR 54 | Ht 63.5 in | Wt 193.2 lb

## 2023-04-06 DIAGNOSIS — R0602 Shortness of breath: Secondary | ICD-10-CM | POA: Insufficient documentation

## 2023-04-06 DIAGNOSIS — R5383 Other fatigue: Secondary | ICD-10-CM | POA: Insufficient documentation

## 2023-04-06 DIAGNOSIS — I10 Essential (primary) hypertension: Secondary | ICD-10-CM | POA: Insufficient documentation

## 2023-04-06 NOTE — Patient Instructions (Signed)
Medication Instructions:  Your physician recommends that you continue on your current medications as directed. Please refer to the Current Medication list given to you today.  *If you need a refill on your cardiac medications before your next appointment, please call your pharmacy*   Lab Work: none If you have labs (blood work) drawn today and your tests are completely normal, you will receive your results only by: MyChart Message (if you have MyChart) OR A paper copy in the mail If you have any lab test that is abnormal or we need to change your treatment, we will call you to review the results.   Testing/Procedures: Your physician has requested that you have an echocardiogram. Echocardiography is a painless test that uses sound waves to create images of your heart. It provides your doctor with information about the size and shape of your heart and how well your heart's chambers and valves are working. This procedure takes approximately one hour. There are no restrictions for this procedure. Please do NOT wear cologne, perfume, aftershave, or lotions (deodorant is allowed). Please arrive 15 minutes prior to your appointment time.    Follow-Up: At [facility], you and your health needs are our priority.  As part of our continuing mission to provide you with exceptional heart care, we have created designated Provider Care Teams.  These Care Teams include your primary Cardiologist (physician) and Advanced Practice Providers (APPs -  Physician Assistants and Nurse Practitioners) who all work together to provide you with the care you need, when you need it.  We recommend signing up for the patient portal called "MyChart".  Sign up information is provided on this After Visit Summary.  MyChart is used to connect with patients for Virtual Visits (Telemedicine).  Patients are able to view lab/test results, encounter notes, upcoming appointments, etc.  Non-urgent messages can be sent to your  provider as well.   To learn more about what you can do with MyChart, go to [email or web address].    Your next appointment:   As needed based on results  Provider:   Lance Muss, MD     Other Instructions  Check blood pressure at home and keep record of readings.  Send readings in through my chart or call to office.

## 2023-04-06 NOTE — Progress Notes (Signed)
Cardiology Office Note   Date:  04/06/2023   ID:  YASHASVI MANKA, DOB 09-Aug-1941, MRN [id number]  PCP:  Daisy Floro, MD    No chief complaint on file.  Fatigue  Wt Readings from Last 3 Encounters:  04/06/23 193 lb 3.2 oz (87.6 kg)  07/21/22 200 lb (90.7 kg)  06/03/22 200 lb 8 oz (90.9 kg)       History of Present Illness: Beverly Moreno is a 82 y.o. female with a history of hypertension who is being seen today for the evaluation of fatigue at the request of Daisy Floro, MD.  There is some heart disease noted in the family.  Father had an aneurysm.  Mother had CAD.  2023 CT scan of the abdomen showed "Mild aortic and branch vessel atherosclerosis without evidence of aneurysm or large vessel occlusion."  In 3/24, she had a severe back pain that was causing soe shortness of breath.  SHe took a muscle relaxer with some relief, but pain in the right side of her back has persisted.    SHe has had HTN for 30 years.  She has been on meds.    She is active on flat ground.  No sx on flat ground.   Already eats a low salt diet.     Past Medical History:  Diagnosis Date   Arthritis    Back pain    BMI 35.0-35.9,adult    CAD (coronary artery disease)    Constipation    Dry eye    GERD (gastroesophageal reflux disease)    Heart murmur, systolic    Hyperlipidemia    Hypertension    Nasal congestion    Obesity    Osteoarthritis    Osteopenia    Presence of right artificial knee joint    Sjogren syndrome, unspecified (HCC)    Varicose veins    Vitamin D deficiency     Past Surgical History:  Procedure Laterality Date   ABDOMINAL HYSTERECTOMY     APPENDECTOMY     age 5   ROTATOR CUFF REPAIR Right    TONSILLECTOMY     as child   TOTAL KNEE ARTHROPLASTY Right 04/09/2014   Procedure: RIGHT TOTAL KNEE ARTHROPLASTY;  Surgeon: Loanne Drilling, MD;  Location: [facility];  Service: Orthopedics;  Laterality: Right;     Current Outpatient Medications   Medication Sig Dispense Refill   aspirin 81 MG tablet Take 81 mg by mouth daily.     Cholecalciferol (VITAMIN D3) 2000 units TABS Take by mouth daily.     cycloSPORINE (RESTASIS) 0.05 % ophthalmic emulsion Place 1 drop into both eyes 2 (two) times daily.     Glycerin-Polysorbate 80 (REFRESH DRY EYE THERAPY OP) Place 3 drops into both eyes daily.     hydrochlorothiazide (MICROZIDE) 12.5 MG capsule TAKE 1 CAPSULE BY MOUTH ONCE DAILY IN THE MORNING IF NEEDED FOR SWELLING OR BLOOD PRESSURE 30 DAY(S)     ibuprofen (ADVIL,MOTRIN) 200 MG tablet Take 200 mg by mouth every 6 (six) hours as needed.     methocarbamol (ROBAXIN) 500 MG tablet Take 1 tablet (500 mg total) by mouth every 4 (four) hours as needed for tight muscles. 30 tablet 1   Multiple Vitamins-Minerals (OCUVITE EYE HEALTH FORMULA) CAPS Take 1 tablet by mouth daily.     olmesartan-hydrochlorothiazide (BENICAR HCT) 40-12.5 MG tablet Take 1 tablet by mouth daily.     polyethylene glycol powder (GLYCOLAX/MIRALAX) 17 GM/SCOOP powder Take 17 g by  mouth daily. Increase to twice a day if needed     predniSONE (DELTASONE) 10 MG tablet Take 4 tablets once daily for 3 days, THEN 3 tabs daily for 3 days, THEN 2 tabs daily for 3 days, THEN 1 tab daily for 3 days. 30 tablet 0   vitamin C (ASCORBIC ACID) 500 MG tablet Take 500 mg by mouth daily.     atorvastatin (LIPITOR) 20 MG tablet Take 20 mg by mouth daily. (Patient not taking: Reported on 04/06/2023)     MAGNESIUM GLYCINATE PO Take 400 mg by mouth daily. (Patient not taking: Reported on 02/18/2023)     Multiple Vitamin (MULTIVITAMIN) tablet Take 1 tablet by mouth daily. Nutrafol     No current facility-administered medications for this visit.    Allergies:   Celecoxib    Social History:  The patient  reports that she quit smoking about 44 years ago. Her smoking use included cigarettes. She has never used smokeless tobacco. She reports current alcohol use. She reports that she does not use drugs.    Family History:  The patient's family history includes Anuerysm in her father; Breast cancer in her maternal grandmother; Gallstones in her father; Heart attack in her mother; Heart disease in her mother; Hypertension in her father; Kidney Stones in her sister; Lung cancer in her maternal grandfather.    ROS:  Please see the history of present illness.   Otherwise, review of systems are positive for back pain.   All other systems are reviewed and negative.    PHYSICAL EXAM: VS:  BP (!) 160/72   Pulse (!) 54   Ht 5' 3.5" (1.613 m)   Wt 193 lb 3.2 oz (87.6 kg)   SpO2 95%   BMI 33.69 kg/m  , BMI Body mass index is 33.69 kg/m. GEN: Well nourished, well developed, in no acute distress HEENT: normal Neck: no JVD, carotid bruits, or masses Cardiac: RRR; no murmurs, rubs, or gallops,no edema  Respiratory:  clear to auscultation bilaterally, normal work of breathing GI: soft, nontender, nondistended, + BS MS: no deformity or atrophy Skin: warm and dry, no rash; spider veins on legs Neuro:  Strength and sensation are intact Psych: euthymic mood, full affect   EKG:   The ekg ordered today demonstrates NSR, LBBB   Recent Labs: 07/21/2022: BUN 18; Creatinine, Ser 0.84   Lipid Panel No results found for: "CHOL", "TRIG", "HDL", "CHOLHDL", "VLDL", "LDLCALC", "LDLDIRECT"   Other studies Reviewed: Additional studies/ records that were reviewed today with results demonstrating: .   ASSESSMENT AND PLAN:  Fatigue/SHOB : Plan for echocardiogram to evaluate for any structural heart issues or LV dysfunction. HTN: Low-salt diet.  Avoid processed foods.  Blood pressure elevated today.  Check blood pressure readings at home.  If they continue to be elevated after pain is relieved, will add amlodipine 5 mg daily.  Leg edema has been present prior to amlodipine.  She feels that the elevated blood pressure is related to her back pain.  When the back pain is better, her blood pressure is in the  normal range.  I do not want to add medication if the high blood pressure readings are temporary. LBBB: chronic since at least 2015. Dizziness: Chronic issue.  Blood pressure on recheck was improved today at 150/62.  I encouraged her to stay well-hydrated.     Current medicines are reviewed at length with the patient today.  The patient concerns regarding her medicines were addressed.  The following changes have  been made:  No change  Labs/ tests ordered today include:  No orders of the defined types were placed in this encounter.   Recommend 150 minutes/week of aerobic exercise Low fat, low carb, high fiber diet recommended  Disposition:   FU based on the echo   Signed, Lance Muss, MD  04/06/2023 4:01 PM    [facility] [address] [place], [place], [place]  [address] Phone: [phone]; Fax: [phone]

## 2023-04-07 DIAGNOSIS — Z0000 Encounter for general adult medical examination without abnormal findings: Secondary | ICD-10-CM | POA: Diagnosis not present

## 2023-04-14 ENCOUNTER — Other Ambulatory Visit (HOSPITAL_BASED_OUTPATIENT_CLINIC_OR_DEPARTMENT_OTHER): Payer: Self-pay

## 2023-04-14 MED ORDER — OLMESARTAN MEDOXOMIL-HCTZ 40-12.5 MG PO TABS
1.0000 | ORAL_TABLET | Freq: Every day | ORAL | 1 refills | Status: DC
  Filled 2023-04-14: qty 90, 90d supply, fill #0
  Filled 2023-07-31 (×2): qty 90, 90d supply, fill #1

## 2023-04-15 ENCOUNTER — Other Ambulatory Visit (HOSPITAL_BASED_OUTPATIENT_CLINIC_OR_DEPARTMENT_OTHER): Payer: Self-pay

## 2023-05-10 ENCOUNTER — Ambulatory Visit (HOSPITAL_COMMUNITY): Payer: Medicare Other | Attending: Cardiology

## 2023-05-10 DIAGNOSIS — R5383 Other fatigue: Secondary | ICD-10-CM | POA: Insufficient documentation

## 2023-05-10 DIAGNOSIS — R0602 Shortness of breath: Secondary | ICD-10-CM | POA: Insufficient documentation

## 2023-05-10 LAB — ECHOCARDIOGRAM COMPLETE
Area-P 1/2: 2.05 cm2
P 1/2 time: 548 msec
S' Lateral: 3.1 cm

## 2023-05-11 ENCOUNTER — Telehealth: Payer: Self-pay | Admitting: *Deleted

## 2023-05-11 DIAGNOSIS — I77810 Thoracic aortic ectasia: Secondary | ICD-10-CM

## 2023-05-11 NOTE — Telephone Encounter (Signed)
Dr Eldridge Dace would like to see patient back in one year.  If BP not improved will need follow up in hypertension clinic sooner.   Results reviewed with patient.  She reports sometimes BP is running lower but other times she has spikes that happen at random times.  She has been doing water exercises.  She has arthritis and when this is bothering her and she has back pain her BP is higher. Patient has been checking BP at home and will send readings in through my chart.

## 2023-05-11 NOTE — Telephone Encounter (Signed)
-----   Message from Corky Crafts, MD sent at 05/10/2023  5:28 PM EDT ----- Normal LV/RV function. Mild AI.  Dilated Aorta.  Will need a f/u CTA aorta in 6 months to see if there is any change.  Not related to her shortness of breath that prompted echo, but good to follow for change in size.

## 2023-05-17 ENCOUNTER — Encounter: Payer: Self-pay | Admitting: Interventional Cardiology

## 2023-05-17 DIAGNOSIS — G4710 Hypersomnia, unspecified: Secondary | ICD-10-CM | POA: Diagnosis not present

## 2023-05-17 DIAGNOSIS — R5383 Other fatigue: Secondary | ICD-10-CM | POA: Diagnosis not present

## 2023-05-17 DIAGNOSIS — Z6835 Body mass index (BMI) 35.0-35.9, adult: Secondary | ICD-10-CM | POA: Diagnosis not present

## 2023-05-17 DIAGNOSIS — I77810 Thoracic aortic ectasia: Secondary | ICD-10-CM | POA: Diagnosis not present

## 2023-05-17 DIAGNOSIS — I10 Essential (primary) hypertension: Secondary | ICD-10-CM | POA: Diagnosis not present

## 2023-06-08 DIAGNOSIS — I10 Essential (primary) hypertension: Secondary | ICD-10-CM | POA: Diagnosis not present

## 2023-06-08 DIAGNOSIS — G4719 Other hypersomnia: Secondary | ICD-10-CM | POA: Diagnosis not present

## 2023-07-31 ENCOUNTER — Other Ambulatory Visit (HOSPITAL_BASED_OUTPATIENT_CLINIC_OR_DEPARTMENT_OTHER): Payer: Self-pay

## 2023-08-16 DIAGNOSIS — H109 Unspecified conjunctivitis: Secondary | ICD-10-CM | POA: Diagnosis not present

## 2023-09-01 DIAGNOSIS — H109 Unspecified conjunctivitis: Secondary | ICD-10-CM | POA: Diagnosis not present

## 2023-09-01 DIAGNOSIS — H04123 Dry eye syndrome of bilateral lacrimal glands: Secondary | ICD-10-CM | POA: Diagnosis not present

## 2023-10-18 ENCOUNTER — Other Ambulatory Visit (HOSPITAL_BASED_OUTPATIENT_CLINIC_OR_DEPARTMENT_OTHER): Payer: Self-pay

## 2023-10-18 DIAGNOSIS — Z23 Encounter for immunization: Secondary | ICD-10-CM | POA: Diagnosis not present

## 2023-10-18 MED ORDER — COVID-19 MRNA VAC-TRIS(PFIZER) 30 MCG/0.3ML IM SUSY
0.3000 mL | PREFILLED_SYRINGE | Freq: Once | INTRAMUSCULAR | 0 refills | Status: AC
  Filled 2023-10-18: qty 0.3, 1d supply, fill #0

## 2023-10-26 ENCOUNTER — Other Ambulatory Visit (HOSPITAL_BASED_OUTPATIENT_CLINIC_OR_DEPARTMENT_OTHER): Payer: Self-pay

## 2023-10-27 ENCOUNTER — Other Ambulatory Visit (HOSPITAL_BASED_OUTPATIENT_CLINIC_OR_DEPARTMENT_OTHER): Payer: Self-pay

## 2023-10-27 ENCOUNTER — Other Ambulatory Visit: Payer: Self-pay

## 2023-10-27 MED ORDER — OLMESARTAN MEDOXOMIL-HCTZ 40-12.5 MG PO TABS
1.0000 | ORAL_TABLET | Freq: Every day | ORAL | 1 refills | Status: DC
  Filled 2023-10-27 (×2): qty 90, 90d supply, fill #0

## 2023-11-05 ENCOUNTER — Other Ambulatory Visit (HOSPITAL_BASED_OUTPATIENT_CLINIC_OR_DEPARTMENT_OTHER): Payer: Self-pay

## 2023-11-05 DIAGNOSIS — Z23 Encounter for immunization: Secondary | ICD-10-CM | POA: Diagnosis not present

## 2023-11-05 MED ORDER — FLULAVAL 0.5 ML IM SUSY
0.5000 mL | PREFILLED_SYRINGE | Freq: Once | INTRAMUSCULAR | 0 refills | Status: AC
  Filled 2023-11-05: qty 0.5, 1d supply, fill #0

## 2023-11-12 ENCOUNTER — Ambulatory Visit (HOSPITAL_BASED_OUTPATIENT_CLINIC_OR_DEPARTMENT_OTHER)
Admission: RE | Admit: 2023-11-12 | Discharge: 2023-11-12 | Disposition: A | Discharge status: Home / Self Care | Payer: Medicare Other | Source: Ambulatory Visit | Attending: Interventional Cardiology | Admitting: Interventional Cardiology

## 2023-11-12 DIAGNOSIS — I700 Atherosclerosis of aorta: Secondary | ICD-10-CM | POA: Diagnosis not present

## 2023-11-12 DIAGNOSIS — I77810 Thoracic aortic ectasia: Secondary | ICD-10-CM | POA: Insufficient documentation

## 2023-11-12 MED ORDER — IOHEXOL 350 MG/ML SOLN
75.0000 mL | Freq: Once | INTRAVENOUS | Status: AC | PRN
  Administered 2023-11-12: 75 mL via INTRAVENOUS

## 2023-12-14 ENCOUNTER — Other Ambulatory Visit: Payer: Self-pay | Admitting: *Deleted

## 2023-12-14 DIAGNOSIS — I77810 Thoracic aortic ectasia: Secondary | ICD-10-CM

## 2024-01-06 ENCOUNTER — Ambulatory Visit: Payer: Medicare Other | Admitting: Internal Medicine

## 2024-02-04 DIAGNOSIS — H9190 Unspecified hearing loss, unspecified ear: Secondary | ICD-10-CM | POA: Diagnosis not present

## 2024-02-04 DIAGNOSIS — H6120 Impacted cerumen, unspecified ear: Secondary | ICD-10-CM | POA: Diagnosis not present

## 2024-02-04 DIAGNOSIS — Z6834 Body mass index (BMI) 34.0-34.9, adult: Secondary | ICD-10-CM | POA: Diagnosis not present

## 2024-02-04 DIAGNOSIS — M792 Neuralgia and neuritis, unspecified: Secondary | ICD-10-CM | POA: Diagnosis not present

## 2024-02-22 ENCOUNTER — Encounter (INDEPENDENT_AMBULATORY_CARE_PROVIDER_SITE_OTHER): Payer: Self-pay | Admitting: Otolaryngology

## 2024-04-04 ENCOUNTER — Other Ambulatory Visit (HOSPITAL_COMMUNITY): Payer: Self-pay

## 2024-04-04 ENCOUNTER — Ambulatory Visit: Payer: Medicare Other | Attending: Internal Medicine | Admitting: Internal Medicine

## 2024-04-04 VITALS — BP 170/80 | HR 60 | Ht 63.5 in | Wt 191.2 lb

## 2024-04-04 DIAGNOSIS — I447 Left bundle-branch block, unspecified: Secondary | ICD-10-CM | POA: Insufficient documentation

## 2024-04-04 DIAGNOSIS — Z79899 Other long term (current) drug therapy: Secondary | ICD-10-CM | POA: Insufficient documentation

## 2024-04-04 DIAGNOSIS — I77810 Thoracic aortic ectasia: Secondary | ICD-10-CM | POA: Diagnosis not present

## 2024-04-04 DIAGNOSIS — I351 Nonrheumatic aortic (valve) insufficiency: Secondary | ICD-10-CM | POA: Diagnosis present

## 2024-04-04 DIAGNOSIS — I340 Nonrheumatic mitral (valve) insufficiency: Secondary | ICD-10-CM | POA: Insufficient documentation

## 2024-04-04 DIAGNOSIS — I10 Essential (primary) hypertension: Secondary | ICD-10-CM | POA: Diagnosis not present

## 2024-04-04 MED ORDER — ATORVASTATIN CALCIUM 20 MG PO TABS
20.0000 mg | ORAL_TABLET | Freq: Every day | ORAL | 3 refills | Status: AC
  Filled 2024-04-04: qty 90, 90d supply, fill #0
  Filled 2024-07-18: qty 90, 90d supply, fill #1

## 2024-04-04 MED ORDER — OLMESARTAN MEDOXOMIL-HCTZ 40-25 MG PO TABS
1.0000 | ORAL_TABLET | Freq: Every day | ORAL | 3 refills | Status: AC
  Filled 2024-04-04: qty 90, 90d supply, fill #0
  Filled 2024-07-18: qty 90, 90d supply, fill #1

## 2024-04-04 NOTE — Progress Notes (Signed)
 Cardiology Office Note:  .   Date:  04/04/2024  ID:  ILLYANNA PETILLO, DOB [birth year], MRN [id number] PCP: Daisy Floro, MD  [facility] Providers Cardiologist:  Parke Poisson, MD    History of Present Illness: .   Beverly Moreno is a 83 y.o. female.  Discussed the use of AI scribe software for clinical note transcription with the patient, who gave verbal consent to proceed.  History of Present Illness The patient, with a history of hypertension and left bundle branch block, presents for a routine follow-up. The patient reports that her blood pressure has been running slightly high at home, around 140. The patient denies any symptoms of lightheadedness or dizziness. The patient also reports experiencing pain related to arthritis and a previous knee replacement, for which she occasionally takes Advil. The patient has a family history of heart disease, with her father having an aneurysm and mother having coronary artery disease. The patient is a former smoker, having quit about forty-five years ago. The patient's current medications include olmesartan hydrochlorothiazide for blood pressure and atorvastatin for cholesterol. The patient does not take a daily aspirin due to concerns about hemorrhoids.    ROS: negative except per HPI above.  Studies Reviewed: Marland Kitchen   EKG Interpretation Date/Time:  Tuesday April 04 2024 08:25:15 EDT Ventricular Rate:  60 PR Interval:  184 QRS Duration:  136 QT Interval:  448 QTC Calculation: 448 R Axis:   -54  Text Interpretation: Sinus rhythm with Premature atrial complexes Left axis deviation Left bundle branch block Confirmed by Weston Brass ([id number]) on 04/04/2024 8:26:44 AM    Results RADIOLOGY CT Angio Chest: Ascending aorta diameter 41 mm, small amount of calcium in obtuse marginal artery (10/2023)  DIAGNOSTIC Echocardiogram: Ejection fraction 55-60%, mild LVH, grade one diastolic dysfunction, mild mitral valve regurgitation,  mild aortic valve regurgitation, aortic valve sclerosis (04/05/2023) EKG: Left bundle branch block, chronic since 2015 Risk Assessment/Calculations:         Physical Exam:   VS:  BP (!) 170/80 (BP Location: Right Arm)   Pulse 60   Ht 5' 3.5" (1.613 m)   Wt 191 lb 3.2 oz (86.7 kg)   BMI 33.34 kg/m    Wt Readings from Last 3 Encounters:  04/04/24 191 lb 3.2 oz (86.7 kg)  04/06/23 193 lb 3.2 oz (87.6 kg)  07/21/22 200 lb (90.7 kg)     Physical Exam VITALS: BP- 140/ GENERAL: Alert, cooperative, well developed, no acute distress. HEENT: Normocephalic, normal oropharynx, moist mucous membranes. CHEST: Clear to auscultation bilaterally, no wheezes, rhonchi, or crackles. CARDIOVASCULAR: Normal heart rate and rhythm, S1 and S2 normal with 1/6 systolic murmur ABDOMEN: Soft, non-tender, non-distended, without organomegaly, normal bowel sounds. EXTREMITIES: No cyanosis or edema. NEUROLOGICAL: Cranial nerves grossly intact, moves all extremities without gross motor or sensory deficit.   ASSESSMENT AND PLAN: .    Assessment & Plan Aortic Valve Sclerosis and Regurgitation Mild aortic valve regurgitation and sclerosis on echocardiogram. CT scan showed normal ascending aorta diameter for age. - Order repeat echocardiogram to assess valve function and aorta size.  Mitral Valve Regurgitation Mild mitral valve regurgitation on previous echocardiogram. - Order repeat echocardiogram.  Left Bundle Branch Block Chronic left bundle branch block on EKG since 2015. No symptoms reported.  Hypertension Blood pressure slightly elevated at 140 mmHg. On olmesartan hydrochlorothiazide 40/12.5 mg daily. Discussed risk of hypotension and falls. - Increase hydrochlorothiazide to 25 mg in the olmesartan hydrochlorothiazide combination after current supply is finished. -  Order lab work 10 days after starting the new prescription. - Monitor blood pressure at home.  Hyperlipidemia Cholesterol levels  well-controlled on atorvastatin 20 mg daily. - Refill atorvastatin prescription.  Neuropathy Recent onset of neuropathy in legs and feet causing significant pain. Advised to limit Advil use due to potential side effects. - Advise to limit Advil use to once a day or less to avoid gastrointestinal and renal side effects.   Follow-up - Schedule follow-up appointment in one year. - Advise to contact if any new symptoms or concerns arise.      Weston Brass, MD, [facility]

## 2024-04-04 NOTE — Patient Instructions (Signed)
 Medication Instructions:  Your physician has recommended you make the following change in your medication:  After finishing current dose START: Olmesartan-Hydrochlorothiazide 40-25 mg once daily  *If you need a refill on your cardiac medications before your next appointment, please call your pharmacy*  Lab Work: 10 days after starting increased dose: BMET If you have labs (blood work) drawn today and your tests are completely normal, you will receive your results only by: MyChart Message (if you have MyChart) OR A paper copy in the mail If you have any lab test that is abnormal or we need to change your treatment, we will call you to review the results.  Testing/Procedures: Your physician has requested that you have an echocardiogram. Echocardiography is a painless test that uses sound waves to create images of your heart. It provides your doctor with information about the size and shape of your heart and how well your heart's chambers and valves are working. This procedure takes approximately one hour. There are no restrictions for this procedure. Please do NOT wear cologne, perfume, aftershave, or lotions (deodorant is allowed). Please arrive 15 minutes prior to your appointment time.  Please note: We ask at that you not bring children with you during ultrasound (echo/ vascular) testing. Due to room size and safety concerns, children are not allowed in the ultrasound rooms during exams. Our front office staff cannot provide observation of children in our lobby area while testing is being conducted. An adult accompanying a patient to their appointment will only be allowed in the ultrasound room at the discretion of the ultrasound technician under special circumstances. We apologize for any inconvenience.   Follow-Up: At [facility], you and your health needs are our priority.  As part of our continuing mission to provide you with exceptional heart care, our providers are all part of  one team.  This team includes your primary Cardiologist (physician) and Advanced Practice Providers or APPs (Physician Assistants and Nurse Practitioners) who all work together to provide you with the care you need, when you need it.  Your next appointment:   1 year(s)  Provider:   Parke Poisson, MD     Other Instructions      1st Floor: - Lobby - Registration  - Pharmacy  - Lab - Cafe  2nd Floor: - PV Lab - Diagnostic Testing (echo, CT, nuclear med)  3rd Floor: - Vacant  4th Floor: - TCTS (cardiothoracic surgery) - AFib Clinic - Structural Heart Clinic - Vascular Surgery  - Vascular Ultrasound  5th Floor: - [facility]Care Cardiology (general and EP) - Clinical Pharmacy for coumadin, hypertension, lipid, weight-loss medications, and med management appointments    Valet parking services will be available as well.

## 2024-04-10 ENCOUNTER — Other Ambulatory Visit (HOSPITAL_COMMUNITY): Payer: Self-pay

## 2024-04-10 DIAGNOSIS — E782 Mixed hyperlipidemia: Secondary | ICD-10-CM | POA: Diagnosis not present

## 2024-04-10 DIAGNOSIS — Z0000 Encounter for general adult medical examination without abnormal findings: Secondary | ICD-10-CM | POA: Diagnosis not present

## 2024-04-10 DIAGNOSIS — Z6833 Body mass index (BMI) 33.0-33.9, adult: Secondary | ICD-10-CM | POA: Diagnosis not present

## 2024-04-10 DIAGNOSIS — Z1331 Encounter for screening for depression: Secondary | ICD-10-CM | POA: Diagnosis not present

## 2024-04-10 DIAGNOSIS — E559 Vitamin D deficiency, unspecified: Secondary | ICD-10-CM | POA: Diagnosis not present

## 2024-04-10 DIAGNOSIS — I10 Essential (primary) hypertension: Secondary | ICD-10-CM | POA: Diagnosis not present

## 2024-04-12 ENCOUNTER — Other Ambulatory Visit (HOSPITAL_COMMUNITY): Payer: Self-pay

## 2024-04-13 DIAGNOSIS — M542 Cervicalgia: Secondary | ICD-10-CM | POA: Diagnosis not present

## 2024-04-13 DIAGNOSIS — E782 Mixed hyperlipidemia: Secondary | ICD-10-CM | POA: Diagnosis not present

## 2024-04-13 DIAGNOSIS — M8580 Other specified disorders of bone density and structure, unspecified site: Secondary | ICD-10-CM | POA: Diagnosis not present

## 2024-04-13 DIAGNOSIS — I10 Essential (primary) hypertension: Secondary | ICD-10-CM | POA: Diagnosis not present

## 2024-04-13 DIAGNOSIS — Z6833 Body mass index (BMI) 33.0-33.9, adult: Secondary | ICD-10-CM | POA: Diagnosis not present

## 2024-04-24 DIAGNOSIS — H43813 Vitreous degeneration, bilateral: Secondary | ICD-10-CM | POA: Diagnosis not present

## 2024-04-24 DIAGNOSIS — H353131 Nonexudative age-related macular degeneration, bilateral, early dry stage: Secondary | ICD-10-CM | POA: Diagnosis not present

## 2024-04-24 DIAGNOSIS — H04123 Dry eye syndrome of bilateral lacrimal glands: Secondary | ICD-10-CM | POA: Diagnosis not present

## 2024-04-24 DIAGNOSIS — H16223 Keratoconjunctivitis sicca, not specified as Sjogren's, bilateral: Secondary | ICD-10-CM | POA: Diagnosis not present

## 2024-05-09 ENCOUNTER — Other Ambulatory Visit (HOSPITAL_COMMUNITY)

## 2024-05-10 ENCOUNTER — Ambulatory Visit (INDEPENDENT_AMBULATORY_CARE_PROVIDER_SITE_OTHER): Admitting: Otolaryngology

## 2024-05-10 ENCOUNTER — Other Ambulatory Visit (HOSPITAL_COMMUNITY): Payer: Self-pay

## 2024-05-10 ENCOUNTER — Encounter (INDEPENDENT_AMBULATORY_CARE_PROVIDER_SITE_OTHER): Payer: Self-pay | Admitting: Otolaryngology

## 2024-05-10 VITALS — BP 131/73 | HR 89 | Ht 63.5 in | Wt 185.0 lb

## 2024-05-10 DIAGNOSIS — J342 Deviated nasal septum: Secondary | ICD-10-CM

## 2024-05-10 DIAGNOSIS — J343 Hypertrophy of nasal turbinates: Secondary | ICD-10-CM | POA: Diagnosis not present

## 2024-05-10 DIAGNOSIS — H903 Sensorineural hearing loss, bilateral: Secondary | ICD-10-CM

## 2024-05-10 DIAGNOSIS — R0981 Nasal congestion: Secondary | ICD-10-CM | POA: Diagnosis not present

## 2024-05-10 DIAGNOSIS — Z87891 Personal history of nicotine dependence: Secondary | ICD-10-CM | POA: Diagnosis not present

## 2024-05-10 DIAGNOSIS — H6123 Impacted cerumen, bilateral: Secondary | ICD-10-CM

## 2024-05-10 DIAGNOSIS — R0982 Postnasal drip: Secondary | ICD-10-CM | POA: Diagnosis not present

## 2024-05-10 DIAGNOSIS — J310 Chronic rhinitis: Secondary | ICD-10-CM | POA: Diagnosis not present

## 2024-05-10 MED ORDER — IPRATROPIUM BROMIDE 0.06 % NA SOLN
2.0000 | Freq: Two times a day (BID) | NASAL | 12 refills | Status: AC | PRN
  Filled 2024-05-10: qty 15, 38d supply, fill #0
  Filled 2024-07-18: qty 15, 21d supply, fill #1

## 2024-05-12 DIAGNOSIS — J342 Deviated nasal septum: Secondary | ICD-10-CM | POA: Insufficient documentation

## 2024-05-12 DIAGNOSIS — J343 Hypertrophy of nasal turbinates: Secondary | ICD-10-CM | POA: Insufficient documentation

## 2024-05-12 DIAGNOSIS — J310 Chronic rhinitis: Secondary | ICD-10-CM | POA: Insufficient documentation

## 2024-05-12 DIAGNOSIS — H6123 Impacted cerumen, bilateral: Secondary | ICD-10-CM | POA: Insufficient documentation

## 2024-05-12 DIAGNOSIS — R0982 Postnasal drip: Secondary | ICD-10-CM | POA: Insufficient documentation

## 2024-05-12 DIAGNOSIS — H903 Sensorineural hearing loss, bilateral: Secondary | ICD-10-CM | POA: Insufficient documentation

## 2024-05-12 NOTE — Progress Notes (Signed)
 CC: Hearing loss, chronic postnasal drainage  HPI:  Beverly Moreno is a 83 y.o. female who presents today complaining of bilateral progressive hearing loss and chronic postnasal drainage.  She has been having difficulty with her hearing for many years.  She is having increasing difficulty hearing over the past year, especially in noisy environments.  She denies any otalgia, otorrhea, or vertigo.  In addition, the patient also complains of chronic postnasal drainage.  She uses Flonase and nasal saline irrigation intermittently.  Currently she denies any facial pain, fever, or visual change.  She underwent adenotonsillectomy as a child.  She has no other ENT surgery.  Past Medical History:  Diagnosis Date   Arthritis    Back pain    BMI 35.0-35.9,adult    CAD (coronary artery disease)    Constipation    Dry eye    GERD (gastroesophageal reflux disease)    Heart murmur, systolic    Hyperlipidemia    Hypertension    Nasal congestion    Obesity    Osteoarthritis    Osteopenia    Presence of right artificial knee joint    Sjogren syndrome, unspecified (HCC)    Varicose veins    Vitamin D deficiency     Past Surgical History:  Procedure Laterality Date   ABDOMINAL HYSTERECTOMY     APPENDECTOMY     age 14   ROTATOR CUFF REPAIR Right    TONSILLECTOMY     as child   TOTAL KNEE ARTHROPLASTY Right 04/09/2014   Procedure: RIGHT TOTAL KNEE ARTHROPLASTY;  Surgeon: Aurther Blue, MD;  Location: [facility];  Service: Orthopedics;  Laterality: Right;    Family History  Problem Relation Age of Onset   Heart disease Mother    Heart attack Mother    Anuerysm Father        Brain   Hypertension Father    Gallstones Father    Kidney Stones Sister    Breast cancer Maternal Grandmother    Lung cancer Maternal Grandfather     Social History:  reports that she quit smoking about 45 years ago. Her smoking use included cigarettes. She has never used smokeless tobacco. She reports current  alcohol use. She reports that she does not use drugs.  Allergies:  Allergies  Allergen Reactions   Celecoxib Other (See Comments) and Diarrhea    Made her feel awful    Prior to Admission medications   Medication Sig Start Date End Date Taking? Authorizing Provider  aspirin 81 MG tablet Take 81 mg by mouth daily.   Yes [provider]  atorvastatin  (LIPITOR) 20 MG tablet Take 1 tablet (20 mg total) by mouth daily. 04/04/24  Yes Acharya, Gayatri A, MD  Cholecalciferol (VITAMIN D3) 2000 units TABS Take by mouth daily.   Yes [provider]  cycloSPORINE (RESTASIS) 0.05 % ophthalmic emulsion Place 1 drop into both eyes 2 (two) times daily.   Yes [provider]  Glycerin-Polysorbate 80 (REFRESH DRY EYE THERAPY OP) Place 3 drops into both eyes daily.   Yes [provider]  hydrochlorothiazide  (MICROZIDE ) 12.5 MG capsule TAKE 1 CAPSULE BY MOUTH ONCE DAILY IN THE MORNING IF NEEDED FOR SWELLING OR BLOOD PRESSURE 30 DAY(S)   Yes [provider]  ibuprofen (ADVIL,MOTRIN) 200 MG tablet Take 200 mg by mouth every 6 (six) hours as needed.   Yes [provider]  ipratropium (ATROVENT) 0.06 % nasal spray Place 2 sprays into both nostrils 2 (two) times daily as  needed (Nasal drainage). 05/10/24 06/17/24 Yes Reynold Caves, MD  MAGNESIUM GLYCINATE PO Take 400 mg by mouth daily.   Yes [provider]  methocarbamol  (ROBAXIN ) 500 MG tablet Take 1 tablet (500 mg total) by mouth every 4 (four) hours as needed for tight muscles. 03/01/23  Yes   Multiple Vitamin (MULTIVITAMIN) tablet Take 1 tablet by mouth daily. Nutrafol   Yes [provider]  Multiple Vitamins-Minerals (OCUVITE EYE HEALTH FORMULA) CAPS Take 1 tablet by mouth daily.   Yes [provider]  olmesartan -hydrochlorothiazide  (BENICAR  HCT) 40-25 MG tablet Take 1 tablet by mouth daily. 04/04/24  Yes Acharya, Gayatri A, MD  polyethylene glycol powder (GLYCOLAX /MIRALAX ) 17 GM/SCOOP powder  Take 17 g by mouth daily. Increase to twice a day if needed   Yes [provider]  vitamin C (ASCORBIC ACID) 500 MG tablet Take 500 mg by mouth daily.   Yes [provider]    Blood pressure 131/73, pulse 89, height 5' 3.5" (1.613 m), weight 185 lb (83.9 kg), SpO2 95%. Exam: General: Communicates without difficulty, well nourished, no acute distress. Head: Normocephalic, no evidence injury, no tenderness, facial buttresses intact without stepoff. Face/sinus: No tenderness to palpation and percussion. Facial movement is normal and symmetric. Eyes: PERRL, EOMI. No scleral icterus, conjunctivae clear. Neuro: CN II exam reveals vision grossly intact.  No nystagmus at any point of gaze. Ears: Auricles well formed without lesions.  Bilateral cerumen impaction.  Nose: External evaluation reveals normal support and skin without lesions.  Dorsum is intact.  Anterior rhinoscopy reveals congested mucosa over anterior aspect of inferior turbinates and intact septum.  No purulence noted. Oral:  Oral cavity and oropharynx are intact, symmetric, without erythema or edema.  Mucosa is moist without lesions. Neck: Full range of motion without pain.  There is no significant lymphadenopathy.  No masses palpable.  Thyroid  bed within normal limits to palpation.  Parotid glands and submandibular glands equal bilaterally without mass.  Trachea is midline. Neuro:  CN 2-12 grossly intact.   Procedure:  Flexible Nasal Endoscopy: Description: Risks, benefits, and alternatives of flexible endoscopy were explained to the patient.  Specific mention was made of the risk of throat numbness with difficulty swallowing, possible bleeding from the nose and mouth, and pain from the procedure.  The patient gave oral consent to proceed.  The flexible scope was inserted into the right nasal cavity.  Endoscopy of the interior nasal cavity, superior, inferior, and middle meatus was performed. The sphenoid-ethmoid recess was  examined. Edematous mucosa was noted.  No polyp, mass, or lesion was appreciated. Nasal septal deviation noted. Olfactory cleft was clear.  Nasopharynx was clear.  Turbinates were hypertrophied but without mass.  The procedure was repeated on the contralateral side with similar findings.  The patient tolerated the procedure well.   Procedure: Bilateral cerumen disimpaction Anesthesia: None Description: Under the operating microscope, the cerumen is carefully removed with a combination of cerumen currette, alligator forceps, and suction catheters.  After the cerumen is removed, the TMs are noted to be normal.  No mass, erythema, or lesions. The patient tolerated the procedure well.    Assessment: 1.  Bilateral cerumen impaction.  After the cerumen disimpaction procedure, both tympanic membranes and middle ear spaces are noted to be normal. 2.  Her history is suggestive of bilateral hearing loss, likely secondary to presbycusis. 3.  Chronic rhinitis with nasal mucosal congestion, nasal septal deviation, and bilateral inferior turbinate hypertrophy. 4.  Chronic postnasal drainage.  Plan: 1.  Otomicroscopy with bilateral disimpaction. 2.  The physical exam and nasal endoscopy findings are reviewed with the patient. 3.  Atrovent nasal spray 2 sprays each nostril twice daily to treat the postnasal drainage. 4.  Continue with Flonase nasal spray and nasal saline irrigation. 5.  The patient will return for reevaluation in 2 months.  We will perform a hearing test at that time.  Rishan Oyama W Milferd Ansell 05/12/2024, 8:56 AM  Question

## 2024-05-13 ENCOUNTER — Other Ambulatory Visit (HOSPITAL_COMMUNITY): Payer: Self-pay

## 2024-05-31 ENCOUNTER — Ambulatory Visit (HOSPITAL_COMMUNITY)
Admission: RE | Admit: 2024-05-31 | Discharge: 2024-05-31 | Disposition: A | Discharge status: Home / Self Care | Source: Ambulatory Visit | Attending: Internal Medicine | Admitting: Internal Medicine

## 2024-05-31 DIAGNOSIS — I351 Nonrheumatic aortic (valve) insufficiency: Secondary | ICD-10-CM | POA: Diagnosis not present

## 2024-06-01 LAB — ECHOCARDIOGRAM COMPLETE
Area-P 1/2: 2.61 cm2
S' Lateral: 2.71 cm

## 2024-06-21 DIAGNOSIS — Z471 Aftercare following joint replacement surgery: Secondary | ICD-10-CM | POA: Diagnosis not present

## 2024-06-21 DIAGNOSIS — Z96651 Presence of right artificial knee joint: Secondary | ICD-10-CM | POA: Diagnosis not present

## 2024-07-18 ENCOUNTER — Other Ambulatory Visit (HOSPITAL_COMMUNITY): Payer: Self-pay

## 2024-07-18 ENCOUNTER — Encounter (INDEPENDENT_AMBULATORY_CARE_PROVIDER_SITE_OTHER): Payer: Self-pay | Admitting: Otolaryngology

## 2024-07-18 ENCOUNTER — Ambulatory Visit (INDEPENDENT_AMBULATORY_CARE_PROVIDER_SITE_OTHER): Admitting: Otolaryngology

## 2024-07-18 ENCOUNTER — Ambulatory Visit (INDEPENDENT_AMBULATORY_CARE_PROVIDER_SITE_OTHER): Admitting: Audiology

## 2024-07-18 VITALS — BP 149/78 | HR 63

## 2024-07-18 DIAGNOSIS — J342 Deviated nasal septum: Secondary | ICD-10-CM | POA: Diagnosis not present

## 2024-07-18 DIAGNOSIS — R0982 Postnasal drip: Secondary | ICD-10-CM

## 2024-07-18 DIAGNOSIS — J343 Hypertrophy of nasal turbinates: Secondary | ICD-10-CM

## 2024-07-18 DIAGNOSIS — H903 Sensorineural hearing loss, bilateral: Secondary | ICD-10-CM

## 2024-07-18 DIAGNOSIS — R0981 Nasal congestion: Secondary | ICD-10-CM | POA: Diagnosis not present

## 2024-07-18 DIAGNOSIS — J310 Chronic rhinitis: Secondary | ICD-10-CM

## 2024-07-18 NOTE — Progress Notes (Signed)
  [address], [address] [place], [place] [address] [phone]  Audiological Evaluation    Name: Beverly Moreno     DOB:   December 15, 1941      MRN:   [id number]                                                                                     Service Date: 07/18/2024     Accompanied by: unaccompanied   Patient comes today after Dr. Karis, ENT sent a referral for a hearing evaluation due to concerns with hearing loss.   Symptoms Yes Details  Hearing loss  [x]  Sometimes notices some difficulty - mentioned the TV  Tinnitus  []    Ear pain/ infections/pressure  []    Balance problems  []    Noise exposure history  []    Previous ear surgeries  []    Family history of hearing loss  []    Amplification  []    Other  []      Otoscopy: Right ear: Clear external ear canal and notable landmarks visualized on the tympanic membrane. Left ear:  Clear external ear canal and notable landmarks visualized on the tympanic membrane.  Tympanometry: Right ear: Type A- Normal external ear canal volume with normal middle ear pressure and tympanic membrane compliance. Left ear: Type A- Normal external ear canal volume with normal middle ear pressure and tympanic membrane compliance.    Pure tone Audiometry: Right ear- Mild to moderately severe sensorineural hearing loss from 125 Hz - 8000 Hz. Left ear-  Normal hearing from 125-500 Hz, then mild to moderately severe sensorineural hearing loss from 1000 Hz - 4000 Hz.  Speech Audiometry: Right ear- Speech Reception Threshold (SRT) was obtained at 40 dBHL. Left ear-Speech Reception Threshold (SRT) was obtained at 40 dBHL.   Word Recognition Score Tested using NU-6 (recorded) Right ear: 84% was obtained at a presentation level of 80 dBHL with contralateral masking which is deemed as  good . Left ear: 84% was obtained at a presentation level of 80 dBHL with contralateral masking which is deemed as  good .   The hearing test results were completed  under headphones and results are deemed to be of good to fair reliability. Test technique:  conventional    Recommendations: Follow up with ENT as scheduled for today. Return for a hearing evaluation if concerns with hearing changes arise or per MD recommendation. Consider a communication needs assessment after medical clearance for hearing aids is obtained.   Maahi Lannan MARIE LEROUX-MARTINEZ, AUD

## 2024-07-18 NOTE — Progress Notes (Signed)
 Patient ID: Beverly Moreno, female   DOB: [birth year], 83 y.o.   MRN: [id number]  Follow-up: Hearing loss, chronic postnasal drainage  HPI: The patient is an 83 year old female who returns today for her follow-up evaluation.  The patient was last seen in May 2025.  At that time, she was complaining of bilateral progressive hearing loss and chronic postnasal drainage.  She was noted to have nasal mucosal congestion, nasal septal deviation, bilateral inferior turbinate hypertrophy, and postnasal drainage.  She was treated with Flonase, Atrovent , and nasal saline irrigation.  The patient returns today complaining of occasional nasal congestion and postnasal drainage.  The medications have helped slightly.  She still has hearing difficulty, especially in noisy environments.  She denies any otalgia, otorrhea, facial pain, or fever.  Exam: General: Communicates without difficulty, well nourished, no acute distress. Head: Normocephalic, no evidence injury, no tenderness, facial buttresses intact without stepoff. Face/sinus: No tenderness to palpation and percussion. Facial movement is normal and symmetric. Eyes: PERRL, EOMI. No scleral icterus, conjunctivae clear. Neuro: CN II exam reveals vision grossly intact.  No nystagmus at any point of gaze. Ears: Auricles well formed without lesions.  Ear canals are intact without mass or lesion.  No erythema or edema is appreciated.  The TMs are intact without fluid. Nose: External evaluation reveals normal support and skin without lesions.  Dorsum is intact.  Anterior rhinoscopy reveals congested mucosa over anterior aspect of inferior turbinates and deviated septum.  No purulence noted. Oral:  Oral cavity and oropharynx are intact, symmetric, without erythema or edema.  Mucosa is moist without lesions. Neck: Full range of motion without pain.  There is no significant lymphadenopathy.  No masses palpable.  Thyroid  bed within normal limits to palpation.  Parotid glands and  submandibular glands equal bilaterally without mass.  Trachea is midline. Neuro:  CN 2-12 grossly intact.   Hearing test shows bilateral high-frequency sensorineural hearing loss.  Assessment: 1.  Chronic rhinitis with nasal mucosal congestion, nasal septal deviation, bilateral inferior turbinate hypertrophy, and chronic postnasal drainage. 2.  Bilateral high-frequency sensorineural hearing loss, consistent with presbycusis.  Plan: 1.  The physical exam findings and the hearing test results are reviewed with the patient. 2.  Continue with Flonase, Atrovent , and nasal saline irrigation. 3.  The patient is a candidate for hearing amplification.  The hearing aid options are discussed. 4.  The patient will return for reevaluation in 1 year.

## 2024-07-24 ENCOUNTER — Encounter: Payer: Self-pay | Admitting: Audiology

## 2024-07-31 ENCOUNTER — Ambulatory Visit: Payer: Self-pay | Admitting: Internal Medicine

## 2024-08-21 DIAGNOSIS — R29898 Other symptoms and signs involving the musculoskeletal system: Secondary | ICD-10-CM | POA: Diagnosis not present

## 2024-08-21 DIAGNOSIS — Z96651 Presence of right artificial knee joint: Secondary | ICD-10-CM | POA: Diagnosis not present

## 2024-08-22 DIAGNOSIS — Z6834 Body mass index (BMI) 34.0-34.9, adult: Secondary | ICD-10-CM | POA: Diagnosis not present

## 2024-08-22 DIAGNOSIS — R413 Other amnesia: Secondary | ICD-10-CM | POA: Diagnosis not present

## 2024-08-22 DIAGNOSIS — R208 Other disturbances of skin sensation: Secondary | ICD-10-CM | POA: Diagnosis not present

## 2024-08-29 ENCOUNTER — Other Ambulatory Visit (HOSPITAL_COMMUNITY): Payer: Self-pay

## 2024-09-28 ENCOUNTER — Other Ambulatory Visit (HOSPITAL_COMMUNITY): Payer: Self-pay

## 2024-09-28 ENCOUNTER — Other Ambulatory Visit: Payer: Self-pay

## 2024-09-28 ENCOUNTER — Emergency Department (HOSPITAL_COMMUNITY)
Admission: EM | Admit: 2024-09-28 | Discharge: 2024-09-28 | Disposition: A | Discharge status: Home / Self Care | Attending: Emergency Medicine | Admitting: Emergency Medicine

## 2024-09-28 ENCOUNTER — Encounter (HOSPITAL_COMMUNITY): Payer: Self-pay | Admitting: *Deleted

## 2024-09-28 ENCOUNTER — Emergency Department (HOSPITAL_COMMUNITY)

## 2024-09-28 DIAGNOSIS — R0602 Shortness of breath: Secondary | ICD-10-CM | POA: Diagnosis not present

## 2024-09-28 DIAGNOSIS — Z7982 Long term (current) use of aspirin: Secondary | ICD-10-CM | POA: Diagnosis not present

## 2024-09-28 DIAGNOSIS — J029 Acute pharyngitis, unspecified: Secondary | ICD-10-CM | POA: Diagnosis not present

## 2024-09-28 DIAGNOSIS — Z79899 Other long term (current) drug therapy: Secondary | ICD-10-CM | POA: Diagnosis not present

## 2024-09-28 DIAGNOSIS — I10 Essential (primary) hypertension: Secondary | ICD-10-CM | POA: Diagnosis not present

## 2024-09-28 DIAGNOSIS — J40 Bronchitis, not specified as acute or chronic: Secondary | ICD-10-CM | POA: Insufficient documentation

## 2024-09-28 DIAGNOSIS — R059 Cough, unspecified: Secondary | ICD-10-CM | POA: Diagnosis present

## 2024-09-28 DIAGNOSIS — R509 Fever, unspecified: Secondary | ICD-10-CM | POA: Diagnosis not present

## 2024-09-28 DIAGNOSIS — I700 Atherosclerosis of aorta: Secondary | ICD-10-CM | POA: Diagnosis not present

## 2024-09-28 LAB — RESP PANEL BY RT-PCR (RSV, FLU A&B, COVID)  RVPGX2
Influenza A by PCR: NEGATIVE
Influenza B by PCR: NEGATIVE
Resp Syncytial Virus by PCR: NEGATIVE
SARS Coronavirus 2 by RT PCR: NEGATIVE

## 2024-09-28 LAB — CBC WITH DIFFERENTIAL/PLATELET
Abs Immature Granulocytes: 0.01 K/uL (ref 0.00–0.07)
Basophils Absolute: 0.1 K/uL (ref 0.0–0.1)
Basophils Relative: 2 %
Eosinophils Absolute: 0.1 K/uL (ref 0.0–0.5)
Eosinophils Relative: 3 %
HCT: 37.9 % (ref 36.0–46.0)
Hemoglobin: 12.1 g/dL (ref 12.0–15.0)
Immature Granulocytes: 0 %
Lymphocytes Relative: 20 %
Lymphs Abs: 1 K/uL (ref 0.7–4.0)
MCH: 27.2 pg (ref 26.0–34.0)
MCHC: 31.9 g/dL (ref 30.0–36.0)
MCV: 85.2 fL (ref 80.0–100.0)
Monocytes Absolute: 0.6 K/uL (ref 0.1–1.0)
Monocytes Relative: 12 %
Neutro Abs: 3.1 K/uL (ref 1.7–7.7)
Neutrophils Relative %: 63 %
Platelets: 234 K/uL (ref 150–400)
RBC: 4.45 MIL/uL (ref 3.87–5.11)
RDW: 13.1 % (ref 11.5–15.5)
WBC: 4.9 K/uL (ref 4.0–10.5)
nRBC: 0 % (ref 0.0–0.2)

## 2024-09-28 LAB — BASIC METABOLIC PANEL WITH GFR
Anion gap: 11 (ref 5–15)
BUN: 15 mg/dL (ref 8–23)
CO2: 25 mmol/L (ref 22–32)
Calcium: 9.6 mg/dL (ref 8.9–10.3)
Chloride: 104 mmol/L (ref 98–111)
Creatinine, Ser: 0.73 mg/dL (ref 0.44–1.00)
GFR, Estimated: >60 mL/min (ref >60)
Glucose, Bld: 104 mg/dL — ABNORMAL HIGH (ref 70–99)
Potassium: 4.1 mmol/L (ref 3.5–5.1)
Sodium: 140 mmol/L (ref 135–145)

## 2024-09-28 LAB — PRO BRAIN NATRIURETIC PEPTIDE: Pro Brain Natriuretic Peptide: 1898 pg/mL — ABNORMAL HIGH (ref <300.0)

## 2024-09-28 LAB — D-DIMER, QUANTITATIVE: D-Dimer, Quant: 0.5 ug{FEU}/mL (ref 0.00–0.50)

## 2024-09-28 MED ORDER — DOXYCYCLINE HYCLATE 100 MG PO CAPS
100.0000 mg | ORAL_CAPSULE | Freq: Two times a day (BID) | ORAL | 0 refills | Status: AC
  Filled 2024-09-28: qty 14, 7d supply, fill #0

## 2024-09-28 MED ORDER — ALBUTEROL SULFATE HFA 108 (90 BASE) MCG/ACT IN AERS
2.0000 | INHALATION_SPRAY | Freq: Once | RESPIRATORY_TRACT | Status: AC
  Administered 2024-09-28: 2 via RESPIRATORY_TRACT
  Filled 2024-09-28: qty 6.7

## 2024-09-28 MED ORDER — PREDNISONE 20 MG PO TABS
60.0000 mg | ORAL_TABLET | Freq: Once | ORAL | Status: AC
Start: 2024-09-28 — End: 2024-09-28
  Administered 2024-09-28: 60 mg via ORAL
  Filled 2024-09-28: qty 3

## 2024-09-28 MED ORDER — PREDNISONE 10 MG PO TABS
40.0000 mg | ORAL_TABLET | Freq: Every day | ORAL | 0 refills | Status: AC
Start: 2024-09-28 — End: 2024-10-02
  Filled 2024-09-28: qty 16, 4d supply, fill #0

## 2024-09-28 MED ORDER — BENZONATATE 100 MG PO CAPS
100.0000 mg | ORAL_CAPSULE | Freq: Three times a day (TID) | ORAL | 0 refills | Status: AC
  Filled 2024-09-28: qty 21, 7d supply, fill #0

## 2024-09-28 MED ORDER — ALBUTEROL SULFATE (2.5 MG/3ML) 0.083% IN NEBU
5.0000 mg | INHALATION_SOLUTION | Freq: Once | RESPIRATORY_TRACT | Status: AC
  Administered 2024-09-28: 5 mg via RESPIRATORY_TRACT
  Filled 2024-09-28: qty 6

## 2024-09-28 NOTE — ED Provider Notes (Signed)
  Physical Exam  BP (!) 175/78   Pulse 68   Temp 98.5 F (36.9 C) (Oral)   Resp (!) 22   SpO2 93%   Physical Exam  Procedures  Procedures  ED Course / MDM    Medical Decision Making Amount and/or Complexity of Data Reviewed Labs: ordered. Radiology: ordered.  Risk Prescription drug management.   83yo female with history of hypertension, hyperlipidemia, sjogren's, coronary artery disease who presents with concern for cough, congestion, dyspnea.  Labs evaluated by me show negative covid/flu/rsv, normal BMP, normal CBC, negative ddimer, proBNP for age near-normal and no signs of edema on XR.  CXR without signs of pneumonia.  Discussed possible viral syndrome, bacterial bronchitis or possibility of occult pneumonia.  Does not have hx of asthma but has had improvement with inhalers in past.  Will give rx for prednisone  given this history of reactive airways disease, given doxycycline for possible occult bacterial pneumonia or bacterial bronchitis.  Given tessalon pearls. Discussed strict return precautions. Patient discharged in stable condition with understanding of reasons to return.        Dreama Longs, MD 09/28/24 [id number]

## 2024-09-28 NOTE — ED Provider Notes (Signed)
 [facility] AT [facility] Provider Note   CSN: [id number] Arrival date & time: 09/28/24  [id number]     Patient presents with: Shortness of Breath   Beverly Moreno is a 83 y.o. female.   The history is provided by the patient and the spouse.  Shortness of Breath Beverly Moreno is a 83 y.o. female who presents to the Emergency Department complaining of shortness of breath. She presents the emergency department for evaluation of three days of shortness of breath, throat discomfort, postnasal drip and nasal congestion, cough. Symptoms started just after returning from a trip to [place]. This morning she had a coughing spell and had significant worsening of her shortness of breath. No associated fever, chest pain, nausea, vomiting. She has chronic intermittent lower extremity edema this is at her baseline. She has a history of hypertension. No history of DVT/PE. No hormone use. No history of CHF or coronary artery disease.      Prior to Admission medications   Medication Sig Start Date End Date Taking? Authorizing Provider  aspirin 81 MG tablet Take 81 mg by mouth daily.    [provider]  atorvastatin  (LIPITOR) 20 MG tablet Take 1 tablet (20 mg total) by mouth daily. 04/04/24   Acharya, Gayatri A, MD  Cholecalciferol (VITAMIN D3) 2000 units TABS Take by mouth daily.    [provider]  cycloSPORINE (RESTASIS) 0.05 % ophthalmic emulsion Place 1 drop into both eyes 2 (two) times daily.    [provider]  Glycerin-Polysorbate 80 (REFRESH DRY EYE THERAPY OP) Place 3 drops into both eyes daily.    [provider]  hydrochlorothiazide  (MICROZIDE ) 12.5 MG capsule TAKE 1 CAPSULE BY MOUTH ONCE DAILY IN THE MORNING IF NEEDED FOR SWELLING OR BLOOD PRESSURE 30 DAY(S)    [provider]  ibuprofen (ADVIL,MOTRIN) 200 MG tablet Take 200 mg by mouth every 6 (six) hours as needed.    [provider]  ipratropium (ATROVENT )  0.06 % nasal spray Place 2 sprays into both nostrils 2 (two) times daily as needed (Nasal drainage). 05/10/24 08/08/24  Karis Clunes, MD  MAGNESIUM GLYCINATE PO Take 400 mg by mouth daily.    [provider]  methocarbamol  (ROBAXIN ) 500 MG tablet Take 1 tablet (500 mg total) by mouth every 4 (four) hours as needed for tight muscles. 03/01/23     Multiple Vitamin (MULTIVITAMIN) tablet Take 1 tablet by mouth daily. Nutrafol    [provider]  Multiple Vitamins-Minerals (OCUVITE EYE HEALTH FORMULA) CAPS Take 1 tablet by mouth daily.    [provider]  olmesartan -hydrochlorothiazide  (BENICAR  HCT) 40-25 MG tablet Take 1 tablet by mouth daily. 04/04/24   Acharya, Gayatri A, MD  polyethylene glycol powder (GLYCOLAX /MIRALAX ) 17 GM/SCOOP powder Take 17 g by mouth daily. Increase to twice a day if needed    [provider]  vitamin C (ASCORBIC ACID) 500 MG tablet Take 500 mg by mouth daily.    [provider]    Allergies: Celecoxib    Review of Systems  Respiratory:  Positive for shortness of breath.   All other systems reviewed and are negative.   Updated Vital Signs BP (!) 175/78   Pulse 68   Temp 98.5 F (36.9 C) (Oral)   Resp (!) 22   SpO2 93%   Physical Exam Vitals and nursing note reviewed.  Constitutional:      Appearance: She is well-developed.  HENT:     Head: Normocephalic and  atraumatic.     Comments: No significant erythema in the oropharynx Cardiovascular:     Rate and Rhythm: Normal rate and regular rhythm.     Heart sounds: No murmur heard. Pulmonary:     Effort: Pulmonary effort is normal. No respiratory distress.     Breath sounds: Normal breath sounds.  Abdominal:     Palpations: Abdomen is soft.     Tenderness: There is no abdominal tenderness. There is no guarding or rebound.  Musculoskeletal:        General: No tenderness.  Skin:    General: Skin is warm and dry.  Neurological:     Mental Status: She is alert and oriented  to person, place, and time.  Psychiatric:        Behavior: Behavior normal.     (all labs ordered are listed, but only abnormal results are displayed) Labs Reviewed  RESP PANEL BY RT-PCR (RSV, FLU A&B, COVID)  [id number]    EKG: None  Radiology: No results found.   Procedures   Medications Ordered in the ED - No data to display                                  Medical Decision Making Amount and/or Complexity of Data Reviewed Labs: ordered. Radiology: ordered.  Risk Prescription drug management.   Pt with hx/o HTN, recent long distance travel here for evaluation of sob, URI sxs.  Lungs are clear on exam without respiratory distress.  Given recent travel will send ddimer, otherwise low risk for PE.  Pt care transferred pending labs, re-eval after neb.        Final diagnoses:  None    ED Discharge Orders     None          Griselda Norris, MD 09/28/24 [phone]

## 2024-09-28 NOTE — ED Notes (Signed)
 Ambulated pt 20 ft O2 stayed in the range of 95-97

## 2024-09-28 NOTE — ED Triage Notes (Signed)
 3 days ago-sore throat, cough- has had increased cough and tonight she has felt SOB. Denies fever.  Recent return from [place]

## 2024-10-03 DIAGNOSIS — Z6834 Body mass index (BMI) 34.0-34.9, adult: Secondary | ICD-10-CM | POA: Diagnosis not present

## 2024-10-03 DIAGNOSIS — Z09 Encounter for follow-up examination after completed treatment for conditions other than malignant neoplasm: Secondary | ICD-10-CM | POA: Diagnosis not present

## 2024-10-03 DIAGNOSIS — R062 Wheezing: Secondary | ICD-10-CM | POA: Diagnosis not present

## 2024-10-13 ENCOUNTER — Other Ambulatory Visit (HOSPITAL_COMMUNITY): Payer: Self-pay

## 2024-10-13 DIAGNOSIS — M62830 Muscle spasm of back: Secondary | ICD-10-CM | POA: Diagnosis not present

## 2024-10-13 DIAGNOSIS — Z6833 Body mass index (BMI) 33.0-33.9, adult: Secondary | ICD-10-CM | POA: Diagnosis not present

## 2024-10-13 MED ORDER — METHOCARBAMOL 500 MG PO TABS
500.0000 mg | ORAL_TABLET | Freq: Every day | ORAL | 0 refills | Status: AC | PRN
  Filled 2024-10-13: qty 15, 15d supply, fill #0

## 2024-10-17 DIAGNOSIS — M62838 Other muscle spasm: Secondary | ICD-10-CM | POA: Diagnosis not present

## 2024-10-17 DIAGNOSIS — M545 Low back pain, unspecified: Secondary | ICD-10-CM | POA: Diagnosis not present

## 2024-10-20 DIAGNOSIS — M542 Cervicalgia: Secondary | ICD-10-CM | POA: Diagnosis not present

## 2024-10-31 DIAGNOSIS — M542 Cervicalgia: Secondary | ICD-10-CM | POA: Diagnosis not present

## 2024-11-02 DIAGNOSIS — M542 Cervicalgia: Secondary | ICD-10-CM | POA: Diagnosis not present

## 2024-11-07 DIAGNOSIS — M542 Cervicalgia: Secondary | ICD-10-CM | POA: Diagnosis not present

## 2024-11-09 DIAGNOSIS — M542 Cervicalgia: Secondary | ICD-10-CM | POA: Diagnosis not present

## 2024-11-14 DIAGNOSIS — M542 Cervicalgia: Secondary | ICD-10-CM | POA: Diagnosis not present

## 2024-11-17 ENCOUNTER — Telehealth: Payer: Self-pay | Admitting: *Deleted

## 2024-11-17 DIAGNOSIS — I351 Nonrheumatic aortic (valve) insufficiency: Secondary | ICD-10-CM

## 2024-11-17 DIAGNOSIS — M542 Cervicalgia: Secondary | ICD-10-CM | POA: Diagnosis not present

## 2024-11-17 DIAGNOSIS — I77810 Thoracic aortic ectasia: Secondary | ICD-10-CM

## 2024-11-17 NOTE — Telephone Encounter (Signed)
-----   Message from Soyla DELENA Merck sent at 11/16/2024  2:09 PM EST ----- Regarding: RE: CT Buel can you help with this ----- Message ----- From: Causey, Katelyn J Sent: 11/15/2024   9:51 AM EST To: Avelina LITTIE Felix, RN; Soyla DELENA Merck, MD Subject: CT                                             Good Morning,   I called patient and she is requesting that her CT scan be scheduled after the 1st of the year. Can someone put a new order in and I can call her back to schedule after the first of the year.   Please and Thank you Katelyn

## 2024-11-30 DIAGNOSIS — M542 Cervicalgia: Secondary | ICD-10-CM | POA: Diagnosis not present

## 2024-12-18 ENCOUNTER — Other Ambulatory Visit (HOSPITAL_COMMUNITY): Payer: Self-pay

## 2024-12-18 ENCOUNTER — Other Ambulatory Visit (HOSPITAL_BASED_OUTPATIENT_CLINIC_OR_DEPARTMENT_OTHER): Payer: Self-pay

## 2024-12-26 ENCOUNTER — Other Ambulatory Visit (HOSPITAL_COMMUNITY): Payer: Self-pay

## 2025-01-17 ENCOUNTER — Other Ambulatory Visit (HOSPITAL_COMMUNITY): Payer: Self-pay | Admitting: Family Medicine

## 2025-01-17 DIAGNOSIS — R42 Dizziness and giddiness: Secondary | ICD-10-CM

## 2025-01-19 ENCOUNTER — Ambulatory Visit (HOSPITAL_COMMUNITY)
Admission: RE | Admit: 2025-01-19 | Discharge: 2025-01-19 | Disposition: A | Source: Ambulatory Visit | Attending: Family Medicine

## 2025-01-19 DIAGNOSIS — R42 Dizziness and giddiness: Secondary | ICD-10-CM | POA: Diagnosis present

## 2025-02-02 ENCOUNTER — Ambulatory Visit (HOSPITAL_COMMUNITY)

## 2025-03-07 ENCOUNTER — Ambulatory Visit: Payer: Self-pay | Admitting: Neurology
